# Patient Record
Sex: Female | Born: 1965
Health system: Southern US, Community
[De-identification: ages and names within clinical notes are randomized; demographics above are authoritative.]

## PROBLEM LIST (undated history)

## (undated) DIAGNOSIS — G039 Meningitis, unspecified: Secondary | ICD-10-CM

## (undated) DIAGNOSIS — M79606 Pain in leg, unspecified: Secondary | ICD-10-CM

## (undated) DIAGNOSIS — R142 Eructation: Secondary | ICD-10-CM

## (undated) DIAGNOSIS — M1990 Unspecified osteoarthritis, unspecified site: Secondary | ICD-10-CM

## (undated) HISTORY — PX: HAND SURGERY: SHX662

## (undated) HISTORY — DX: Eructation: R14.2

## (undated) HISTORY — PX: OTHER SURGICAL HISTORY: SHX169

## (undated) HISTORY — DX: Unspecified osteoarthritis, unspecified site: M19.90

---

## 2005-05-01 ENCOUNTER — Emergency Department (HOSPITAL_COMMUNITY): Admission: AC | Admit: 2005-05-01 | Discharge: 2005-05-01 | Payer: Self-pay

## 2005-05-04 ENCOUNTER — Emergency Department: Payer: Self-pay | Admitting: Unknown Physician Specialty

## 2005-05-07 ENCOUNTER — Emergency Department: Payer: Self-pay | Admitting: Emergency Medicine

## 2008-04-21 ENCOUNTER — Emergency Department: Payer: Self-pay | Admitting: Unknown Physician Specialty

## 2009-04-10 ENCOUNTER — Emergency Department: Payer: Self-pay | Admitting: Emergency Medicine

## 2009-09-29 ENCOUNTER — Ambulatory Visit: Payer: Self-pay | Admitting: Internal Medicine

## 2009-09-29 LAB — CONVERTED CEMR LAB: Microalb, Ur: 0.5 mg/dL (ref 0.00–1.89)

## 2009-10-05 ENCOUNTER — Ambulatory Visit (HOSPITAL_COMMUNITY): Admission: RE | Admit: 2009-10-05 | Discharge: 2009-10-05 | Payer: Self-pay | Admitting: Family Medicine

## 2009-10-14 ENCOUNTER — Encounter: Admission: RE | Admit: 2009-10-14 | Discharge: 2009-11-18 | Payer: Self-pay | Admitting: Family Medicine

## 2009-10-15 ENCOUNTER — Ambulatory Visit: Payer: Self-pay | Admitting: Internal Medicine

## 2009-10-15 LAB — CONVERTED CEMR LAB
ALT: 10 units/L (ref 0–35)
Albumin: 4.2 g/dL (ref 3.5–5.2)
Alkaline Phosphatase: 37 units/L — ABNORMAL LOW (ref 39–117)
Basophils Absolute: 0.1 10*3/uL (ref 0.0–0.1)
Basophils Relative: 2 % — ABNORMAL HIGH (ref 0–1)
CO2: 25 meq/L (ref 19–32)
Calcium: 9.6 mg/dL (ref 8.4–10.5)
Creatinine, Ser: 0.82 mg/dL (ref 0.40–1.20)
Eosinophils Relative: 4 % (ref 0–5)
HCT: 40.1 % (ref 36.0–46.0)
Lymphocytes Relative: 38 % (ref 12–46)
Lymphs Abs: 2.2 10*3/uL (ref 0.7–4.0)
Monocytes Absolute: 0.3 10*3/uL (ref 0.1–1.0)
Monocytes Relative: 5 % (ref 3–12)
Platelets: 403 10*3/uL — ABNORMAL HIGH (ref 150–400)
RBC: 4.23 M/uL (ref 3.87–5.11)
Sed Rate: 4 mm/hr (ref 0–22)
Sodium: 140 meq/L (ref 135–145)
Total CHOL/HDL Ratio: 3.2
Triglycerides: 38 mg/dL (ref <150)
VLDL: 8 mg/dL (ref 0–40)
Vit D, 25-Hydroxy: 41 ng/mL (ref 30–89)

## 2009-10-27 ENCOUNTER — Ambulatory Visit: Payer: Self-pay | Admitting: Internal Medicine

## 2009-12-16 ENCOUNTER — Emergency Department: Payer: Self-pay | Admitting: Unknown Physician Specialty

## 2009-12-22 ENCOUNTER — Ambulatory Visit: Payer: Self-pay | Admitting: Specialist

## 2009-12-25 ENCOUNTER — Ambulatory Visit: Payer: Self-pay | Admitting: Specialist

## 2010-01-07 ENCOUNTER — Ambulatory Visit: Payer: Self-pay | Admitting: Internal Medicine

## 2010-01-14 ENCOUNTER — Ambulatory Visit (HOSPITAL_COMMUNITY): Admission: RE | Admit: 2010-01-14 | Discharge: 2010-01-14 | Payer: Self-pay | Admitting: Internal Medicine

## 2010-02-03 ENCOUNTER — Encounter: Admission: RE | Admit: 2010-02-03 | Discharge: 2010-02-03 | Payer: Self-pay | Admitting: Family Medicine

## 2010-04-02 ENCOUNTER — Ambulatory Visit: Payer: Self-pay | Admitting: Internal Medicine

## 2011-03-03 ENCOUNTER — Emergency Department (HOSPITAL_COMMUNITY)
Admission: EM | Admit: 2011-03-03 | Discharge: 2011-03-03 | Disposition: A | Discharge status: Home / Self Care | Payer: Self-pay | Attending: Emergency Medicine | Admitting: Emergency Medicine

## 2011-03-03 ENCOUNTER — Emergency Department (HOSPITAL_COMMUNITY): Payer: Self-pay

## 2011-03-03 DIAGNOSIS — R110 Nausea: Secondary | ICD-10-CM | POA: Insufficient documentation

## 2011-03-03 DIAGNOSIS — N390 Urinary tract infection, site not specified: Secondary | ICD-10-CM | POA: Insufficient documentation

## 2011-03-03 DIAGNOSIS — R109 Unspecified abdominal pain: Secondary | ICD-10-CM | POA: Insufficient documentation

## 2011-03-03 DIAGNOSIS — R079 Chest pain, unspecified: Secondary | ICD-10-CM | POA: Insufficient documentation

## 2011-03-03 DIAGNOSIS — R6883 Chills (without fever): Secondary | ICD-10-CM | POA: Insufficient documentation

## 2011-03-03 LAB — URINALYSIS, ROUTINE W REFLEX MICROSCOPIC
Bilirubin Urine: NEGATIVE
Nitrite: POSITIVE — AB
Protein, ur: 30 mg/dL — AB
pH: 6.5 (ref 5.0–8.0)

## 2011-03-03 LAB — COMPREHENSIVE METABOLIC PANEL
ALT: 7 U/L (ref 0–35)
BUN: 10 mg/dL (ref 6–23)
GFR calc Af Amer: >60 mL/min (ref >60)
GFR calc non Af Amer: >60 mL/min (ref >60)
Sodium: 135 mEq/L (ref 135–145)

## 2011-03-03 LAB — LIPASE, BLOOD: Lipase: 16 U/L (ref 11–59)

## 2011-03-03 LAB — URINE MICROSCOPIC-ADD ON

## 2011-03-03 LAB — D-DIMER, QUANTITATIVE: D-Dimer, Quant: 0.88 ug/mL-FEU — ABNORMAL HIGH (ref 0.00–0.48)

## 2011-05-06 ENCOUNTER — Other Ambulatory Visit (HOSPITAL_COMMUNITY): Payer: Self-pay | Admitting: Family Medicine

## 2011-05-06 DIAGNOSIS — Z1231 Encounter for screening mammogram for malignant neoplasm of breast: Secondary | ICD-10-CM

## 2011-05-09 ENCOUNTER — Ambulatory Visit (HOSPITAL_COMMUNITY)
Admission: RE | Admit: 2011-05-09 | Discharge: 2011-05-09 | Disposition: A | Discharge status: Home / Self Care | Payer: Self-pay | Source: Ambulatory Visit | Attending: Family Medicine | Admitting: Family Medicine

## 2011-05-09 DIAGNOSIS — Z1231 Encounter for screening mammogram for malignant neoplasm of breast: Secondary | ICD-10-CM | POA: Insufficient documentation

## 2012-02-17 ENCOUNTER — Other Ambulatory Visit (HOSPITAL_COMMUNITY): Payer: Self-pay | Admitting: Family Medicine

## 2012-02-17 ENCOUNTER — Ambulatory Visit (HOSPITAL_COMMUNITY)
Admission: RE | Admit: 2012-02-17 | Discharge: 2012-02-17 | Disposition: A | Discharge status: Home / Self Care | Payer: Self-pay | Source: Ambulatory Visit | Attending: Family Medicine | Admitting: Family Medicine

## 2012-02-17 DIAGNOSIS — M25579 Pain in unspecified ankle and joints of unspecified foot: Secondary | ICD-10-CM | POA: Insufficient documentation

## 2012-02-17 DIAGNOSIS — R52 Pain, unspecified: Secondary | ICD-10-CM

## 2012-02-17 DIAGNOSIS — Z1231 Encounter for screening mammogram for malignant neoplasm of breast: Secondary | ICD-10-CM

## 2012-05-11 ENCOUNTER — Ambulatory Visit (HOSPITAL_COMMUNITY)
Admission: RE | Admit: 2012-05-11 | Discharge: 2012-05-11 | Disposition: A | Discharge status: Home / Self Care | Payer: Self-pay | Source: Ambulatory Visit | Attending: Family Medicine | Admitting: Family Medicine

## 2012-05-11 DIAGNOSIS — Z1231 Encounter for screening mammogram for malignant neoplasm of breast: Secondary | ICD-10-CM

## 2012-06-11 ENCOUNTER — Encounter (HOSPITAL_COMMUNITY): Payer: Self-pay

## 2012-06-11 ENCOUNTER — Inpatient Hospital Stay (HOSPITAL_COMMUNITY)
Admission: AD | Admit: 2012-06-11 | Discharge: 2012-06-11 | Disposition: A | Discharge status: Home / Self Care | Payer: Medicaid Other | Source: Ambulatory Visit | Attending: Obstetrics & Gynecology | Admitting: Obstetrics & Gynecology

## 2012-06-11 DIAGNOSIS — Z202 Contact with and (suspected) exposure to infections with a predominantly sexual mode of transmission: Secondary | ICD-10-CM

## 2012-06-11 DIAGNOSIS — A599 Trichomoniasis, unspecified: Secondary | ICD-10-CM

## 2012-06-11 DIAGNOSIS — A5901 Trichomonal vulvovaginitis: Secondary | ICD-10-CM | POA: Insufficient documentation

## 2012-06-11 DIAGNOSIS — M542 Cervicalgia: Secondary | ICD-10-CM | POA: Insufficient documentation

## 2012-06-11 DIAGNOSIS — M5430 Sciatica, unspecified side: Secondary | ICD-10-CM | POA: Insufficient documentation

## 2012-06-11 HISTORY — DX: Pain in leg, unspecified: M79.606

## 2012-06-11 HISTORY — DX: Meningitis, unspecified: G03.9

## 2012-06-11 LAB — URINALYSIS, ROUTINE W REFLEX MICROSCOPIC
Bilirubin Urine: NEGATIVE
Glucose, UA: NEGATIVE mg/dL
Hgb urine dipstick: NEGATIVE
Ketones, ur: NEGATIVE mg/dL
Specific Gravity, Urine: 1.01 (ref 1.005–1.030)
Urobilinogen, UA: 0.2 mg/dL (ref 0.0–1.0)
pH: 7 (ref 5.0–8.0)

## 2012-06-11 LAB — WET PREP, GENITAL
Clue Cells Wet Prep HPF POC: NONE SEEN
Yeast Wet Prep HPF POC: NONE SEEN

## 2012-06-11 MED ORDER — IBUPROFEN 600 MG PO TABS
600.0000 mg | ORAL_TABLET | Freq: Once | ORAL | Status: AC
  Administered 2012-06-11: 600 mg via ORAL
  Filled 2012-06-11: qty 1

## 2012-06-11 MED ORDER — CYCLOBENZAPRINE HCL 10 MG PO TABS: 10.0000 mg | ORAL_TABLET | Freq: Two times a day (BID) | ORAL | Status: DC | PRN

## 2012-06-11 MED ORDER — METRONIDAZOLE 500 MG PO TABS: 2000.0000 mg | ORAL_TABLET | Freq: Once | ORAL | Status: DC
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
## 2012-06-11 NOTE — MAU Note (Signed)
Had pain in back all the way up to her neck over the weekend, stiffness in her neck today.

## 2012-06-11 NOTE — MAU Note (Signed)
Patient also wants to be checked for STDs

## 2012-06-12 LAB — GC/CHLAMYDIA PROBE AMP, GENITAL: GC Probe Amp, Genital: NEGATIVE

## 2012-06-19 NOTE — MAU Provider Note (Signed)
Medical Screening exam and patient care preformed by advanced practice provider.  Agree with the above management.  

## 2012-10-02 ENCOUNTER — Emergency Department (HOSPITAL_COMMUNITY)
Admission: EM | Admit: 2012-10-02 | Discharge: 2012-10-02 | Disposition: A | Discharge status: Home / Self Care | Payer: No Typology Code available for payment source | Source: Home / Self Care

## 2012-10-02 ENCOUNTER — Encounter (HOSPITAL_COMMUNITY): Payer: Self-pay | Admitting: *Deleted

## 2012-10-02 DIAGNOSIS — Z760 Encounter for issue of repeat prescription: Secondary | ICD-10-CM

## 2012-10-02 DIAGNOSIS — M542 Cervicalgia: Secondary | ICD-10-CM

## 2012-10-02 DIAGNOSIS — M549 Dorsalgia, unspecified: Secondary | ICD-10-CM

## 2012-10-02 MED ORDER — CYCLOBENZAPRINE HCL 10 MG PO TABS: 10.0000 mg | ORAL_TABLET | Freq: Two times a day (BID) | ORAL | Status: DC | PRN

## 2012-10-02 MED ORDER — TRAMADOL HCL 50 MG PO TABS: 50.0000 mg | ORAL_TABLET | Freq: Two times a day (BID) | ORAL | Status: DC | PRN

## 2012-10-02 MED ORDER — DOCUSATE SODIUM 100 MG PO CAPS: 100.0000 mg | ORAL_CAPSULE | Freq: Two times a day (BID) | ORAL | Status: DC

## 2012-10-02 MED ORDER — MELOXICAM 7.5 MG PO TABS: 7.5000 mg | ORAL_TABLET | Freq: Every day | ORAL | Status: DC

## 2012-10-02 NOTE — ED Notes (Signed)
Referral faxed for chiropractor
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
## 2014-05-23 ENCOUNTER — Ambulatory Visit: Payer: Self-pay | Attending: Internal Medicine

## 2014-05-26 ENCOUNTER — Other Ambulatory Visit: Payer: Self-pay

## 2014-05-27 ENCOUNTER — Ambulatory Visit: Payer: Self-pay | Attending: Family Medicine | Admitting: *Deleted

## 2014-05-27 DIAGNOSIS — Z599 Problem related to housing and economic circumstances, unspecified: Secondary | ICD-10-CM

## 2014-05-27 NOTE — Progress Notes (Signed)
Patient identified that she had recently been released from prison and needed support with some community resources.  Patient needed help with job seeking, clothes and housing.  LCSW shared with patient that there were not many resources available for housing without a constant income. LCSW also provided patient with a list of places that provide a clothing resource in the community. LCSW provided patient with a list of potential jobs for patient to follow up.  LCSW also recommended the [facility] in order to enlist case management support.   There are no other needs at this time.  Patient will contact this social work if necessary.    Denise Wiley MSW, LCSW

## 2014-06-06 ENCOUNTER — Encounter: Payer: Self-pay | Admitting: Internal Medicine

## 2014-06-06 ENCOUNTER — Ambulatory Visit: Payer: Self-pay | Attending: Internal Medicine | Admitting: Internal Medicine

## 2014-06-06 VITALS — BP 122/78 | HR 76 | Temp 98.0°F | Resp 16 | Wt 126.4 lb

## 2014-06-06 DIAGNOSIS — M545 Low back pain: Secondary | ICD-10-CM | POA: Insufficient documentation

## 2014-06-06 DIAGNOSIS — Z139 Encounter for screening, unspecified: Secondary | ICD-10-CM

## 2014-06-06 DIAGNOSIS — R1013 Epigastric pain: Secondary | ICD-10-CM

## 2014-06-06 DIAGNOSIS — Z87891 Personal history of nicotine dependence: Secondary | ICD-10-CM | POA: Insufficient documentation

## 2014-06-06 DIAGNOSIS — K30 Functional dyspepsia: Secondary | ICD-10-CM | POA: Insufficient documentation

## 2014-06-06 DIAGNOSIS — G8929 Other chronic pain: Secondary | ICD-10-CM | POA: Insufficient documentation

## 2014-06-06 DIAGNOSIS — M62830 Muscle spasm of back: Secondary | ICD-10-CM | POA: Insufficient documentation

## 2014-06-06 LAB — COMPLETE METABOLIC PANEL WITH GFR
ALBUMIN: 4.5 g/dL (ref 3.5–5.2)
ALK PHOS: 35 U/L — AB (ref 39–117)
ALT: 9 U/L (ref 0–35)
AST: 19 U/L (ref 0–37)
BUN: 8 mg/dL (ref 6–23)
CO2: 26 mEq/L (ref 19–32)
Calcium: 9.4 mg/dL (ref 8.4–10.5)
Chloride: 103 mEq/L (ref 96–112)
Creat: 0.8 mg/dL (ref 0.50–1.10)
GFR, Est African American: >89 mL/min
GFR, Est Non African American: 87 mL/min
Glucose, Bld: 78 mg/dL (ref 70–99)
POTASSIUM: 5.1 meq/L (ref 3.5–5.3)
SODIUM: 139 meq/L (ref 135–145)
TOTAL PROTEIN: 7.3 g/dL (ref 6.0–8.3)
Total Bilirubin: 0.5 mg/dL (ref 0.2–1.2)

## 2014-06-06 LAB — CBC WITH DIFFERENTIAL/PLATELET
BASOS ABS: 0.1 10*3/uL (ref 0.0–0.1)
BASOS PCT: 1 % (ref 0–1)
Eosinophils Absolute: 0.1 10*3/uL (ref 0.0–0.7)
Eosinophils Relative: 1 % (ref 0–5)
HCT: 38.8 % (ref 36.0–46.0)
Hemoglobin: 12.8 g/dL (ref 12.0–15.0)
Lymphocytes Relative: 42 % (ref 12–46)
Lymphs Abs: 2.4 10*3/uL (ref 0.7–4.0)
MCH: 28.5 pg (ref 26.0–34.0)
MCHC: 33 g/dL (ref 30.0–36.0)
MCV: 86.4 fL (ref 78.0–100.0)
Monocytes Absolute: 0.3 10*3/uL (ref 0.1–1.0)
Monocytes Relative: 5 % (ref 3–12)
NEUTROS ABS: 3 10*3/uL (ref 1.7–7.7)
NEUTROS PCT: 51 % (ref 43–77)
Platelets: 355 10*3/uL (ref 150–400)
RBC: 4.49 MIL/uL (ref 3.87–5.11)
RDW: 14.4 % (ref 11.5–15.5)
WBC: 5.8 10*3/uL (ref 4.0–10.5)

## 2014-06-06 LAB — LIPID PANEL
CHOL/HDL RATIO: 5 ratio
Cholesterol: 306 mg/dL — ABNORMAL HIGH (ref 0–200)
HDL: 61 mg/dL (ref >39)
LDL CALC: 234 mg/dL — AB (ref 0–99)
Triglycerides: 54 mg/dL (ref <150)
VLDL: 11 mg/dL (ref 0–40)

## 2014-06-06 LAB — TSH: TSH: 0.191 u[IU]/mL — AB (ref 0.350–4.500)

## 2014-06-06 MED ORDER — CYCLOBENZAPRINE HCL 10 MG PO TABS: 10.0000 mg | ORAL_TABLET | Freq: Two times a day (BID) | ORAL | Status: DC | PRN

## 2014-06-06 MED ORDER — MELOXICAM 7.5 MG PO TABS: 7.5000 mg | ORAL_TABLET | Freq: Every day | ORAL | Status: DC

## 2014-06-06 MED ORDER — OMEPRAZOLE 20 MG PO CPDR: 20.0000 mg | DELAYED_RELEASE_CAPSULE | Freq: Every day | ORAL | Status: DC
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
## 2014-06-06 NOTE — Progress Notes (Signed)
Patient here to establish care Patient states she has a pinched nerve and takes flexeril and tramadol Past was incarcerated for the last sixteen months

## 2014-06-07 LAB — HEMOGLOBIN A1C
Hgb A1c MFr Bld: 5.4 % (ref <5.7)
Mean Plasma Glucose: 108 mg/dL (ref <117)

## 2014-06-07 LAB — VITAMIN D 25 HYDROXY (VIT D DEFICIENCY, FRACTURES): VIT D 25 HYDROXY: 50 ng/mL (ref 30–89)

## 2014-06-09 ENCOUNTER — Encounter: Payer: Self-pay | Admitting: Internal Medicine

## 2014-06-09 ENCOUNTER — Other Ambulatory Visit: Payer: Self-pay | Admitting: Internal Medicine

## 2014-06-09 DIAGNOSIS — R7989 Other specified abnormal findings of blood chemistry: Secondary | ICD-10-CM

## 2014-06-11 ENCOUNTER — Telehealth: Payer: Self-pay | Admitting: Emergency Medicine

## 2014-06-11 MED ORDER — ATORVASTATIN CALCIUM 20 MG PO TABS: 20.0000 mg | ORAL_TABLET | Freq: Every day | ORAL | Status: DC

## 2014-06-11 NOTE — Telephone Encounter (Signed)
-----   Message from Doris Cheadleeepak Advani, MD sent at 06/09/2014 12:31 PM EST ----- Blood work reviewed noticed elevated cholesterol, advise patient for low fat diet and start taking Lipitor 20 mg daily. Also noticed abnormal TSH level, I have ordered full TFT panel, advise patient to do blood work within week.

## 2014-06-11 NOTE — Telephone Encounter (Signed)
Pt given lab results with instructions to start new medication Lipitor 20 mg tab daily  Scheduled lab appointment for next Friday TFT

## 2014-06-20 ENCOUNTER — Other Ambulatory Visit: Payer: Self-pay

## 2014-06-23 ENCOUNTER — Ambulatory Visit: Payer: Self-pay | Attending: Internal Medicine

## 2014-06-23 DIAGNOSIS — R7989 Other specified abnormal findings of blood chemistry: Secondary | ICD-10-CM

## 2014-06-23 LAB — TSH: TSH: 0.242 u[IU]/mL — ABNORMAL LOW (ref 0.350–4.500)

## 2014-06-23 LAB — T4, FREE: FREE T4: 0.9 ng/dL (ref 0.80–1.80)

## 2014-06-23 LAB — T3, FREE: T3, Free: 2.9 pg/mL (ref 2.3–4.2)

## 2014-06-23 MED ORDER — ATORVASTATIN CALCIUM 20 MG PO TABS: 20.0000 mg | ORAL_TABLET | Freq: Every day | ORAL | Status: AC

## 2014-06-26 LAB — THYROID STIMULATING IMMUNOGLOBULIN: TSI: 27 % baseline (ref <140)

## 2014-09-03 ENCOUNTER — Ambulatory Visit (HOSPITAL_COMMUNITY)
Admission: RE | Admit: 2014-09-03 | Discharge: 2014-09-03 | Disposition: A | Discharge status: Home / Self Care | Payer: Self-pay | Source: Ambulatory Visit | Attending: Internal Medicine | Admitting: Internal Medicine

## 2014-09-03 ENCOUNTER — Encounter: Payer: Self-pay | Admitting: Internal Medicine

## 2014-09-03 ENCOUNTER — Ambulatory Visit: Payer: Self-pay | Attending: Internal Medicine | Admitting: Internal Medicine

## 2014-09-03 VITALS — BP 128/84 | HR 86 | Temp 98.6°F | Resp 17 | Wt 128.0 lb

## 2014-09-03 DIAGNOSIS — R1013 Epigastric pain: Secondary | ICD-10-CM | POA: Insufficient documentation

## 2014-09-03 DIAGNOSIS — R2232 Localized swelling, mass and lump, left upper limb: Secondary | ICD-10-CM | POA: Insufficient documentation

## 2014-09-03 DIAGNOSIS — Z87891 Personal history of nicotine dependence: Secondary | ICD-10-CM | POA: Insufficient documentation

## 2014-09-03 DIAGNOSIS — R946 Abnormal results of thyroid function studies: Secondary | ICD-10-CM | POA: Insufficient documentation

## 2014-09-03 DIAGNOSIS — R2242 Localized swelling, mass and lump, left lower limb: Secondary | ICD-10-CM | POA: Insufficient documentation

## 2014-09-03 DIAGNOSIS — Z79899 Other long term (current) drug therapy: Secondary | ICD-10-CM | POA: Insufficient documentation

## 2014-09-03 DIAGNOSIS — K5900 Constipation, unspecified: Secondary | ICD-10-CM | POA: Insufficient documentation

## 2014-09-03 DIAGNOSIS — R2230 Localized swelling, mass and lump, unspecified upper limb: Secondary | ICD-10-CM | POA: Insufficient documentation

## 2014-09-03 DIAGNOSIS — Z1211 Encounter for screening for malignant neoplasm of colon: Secondary | ICD-10-CM

## 2014-09-03 DIAGNOSIS — R7989 Other specified abnormal findings of blood chemistry: Secondary | ICD-10-CM | POA: Insufficient documentation

## 2014-09-03 DIAGNOSIS — E785 Hyperlipidemia, unspecified: Secondary | ICD-10-CM | POA: Insufficient documentation

## 2014-09-03 DIAGNOSIS — Z1231 Encounter for screening mammogram for malignant neoplasm of breast: Secondary | ICD-10-CM

## 2014-09-03 DIAGNOSIS — Z791 Long term (current) use of non-steroidal anti-inflammatories (NSAID): Secondary | ICD-10-CM | POA: Insufficient documentation

## 2014-09-03 MED ORDER — MAGNESIUM HYDROXIDE 400 MG/5ML PO SUSP: 15.0000 mL | Freq: Every day | ORAL | Status: DC | PRN

## 2014-09-03 MED ORDER — OMEPRAZOLE 40 MG PO CPDR: 40.0000 mg | DELAYED_RELEASE_CAPSULE | Freq: Every day | ORAL | Status: DC
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
## 2014-09-03 NOTE — Progress Notes (Signed)
Patient here for follow up Complains of belching a lot Concerned about her weight gain Would like a referral for a mammogram Having some neck pain and would like to have x rays done Complains of lump to the top of her left hand that she noticed about  Two weeks ago

## 2014-09-04 ENCOUNTER — Encounter: Payer: Self-pay | Admitting: Gastroenterology

## 2014-09-10 ENCOUNTER — Telehealth: Payer: Self-pay

## 2014-09-10 NOTE — Telephone Encounter (Signed)
-----   Message from Doris Cheadleeepak Advani, MD sent at 09/09/2014  6:04 PM EST ----- Call and let the patient know that her wrist X-ray reported. IMPRESSION: Mild soft tissue prominence posterior wrist. No foreign body. No underlying bony abnormality . If she has persistent symptoms, she can be referred to orthopedics.

## 2014-09-10 NOTE — Telephone Encounter (Signed)
Spoke with patient and informed her of below results. At this time she does not want a referral, but will follow up if one is needed

## 2014-09-24 ENCOUNTER — Ambulatory Visit (HOSPITAL_COMMUNITY)
Admission: RE | Admit: 2014-09-24 | Discharge: 2014-09-24 | Disposition: A | Discharge status: Home / Self Care | Payer: Self-pay | Source: Ambulatory Visit | Attending: Internal Medicine | Admitting: Internal Medicine

## 2014-09-24 DIAGNOSIS — Z1231 Encounter for screening mammogram for malignant neoplasm of breast: Secondary | ICD-10-CM | POA: Insufficient documentation

## 2014-10-03 ENCOUNTER — Telehealth: Payer: Self-pay | Admitting: Internal Medicine

## 2014-10-03 NOTE — Telephone Encounter (Signed)
Pt is requesting a refill for cyclobenzaprine (FLEXERIL) 10 MG tablet to be sent to [facility] off [address] [place]. [phone].

## 2014-10-03 NOTE — Telephone Encounter (Signed)
Patient can be given refill  

## 2014-10-06 ENCOUNTER — Other Ambulatory Visit: Payer: Self-pay | Admitting: Internal Medicine

## 2014-11-17 ENCOUNTER — Ambulatory Visit (AMBULATORY_SURGERY_CENTER): Payer: Self-pay | Admitting: *Deleted

## 2014-11-17 VITALS — Ht 60.0 in | Wt 123.4 lb

## 2014-11-17 DIAGNOSIS — Z1211 Encounter for screening for malignant neoplasm of colon: Secondary | ICD-10-CM

## 2014-11-17 NOTE — Progress Notes (Signed)
No egg or soy allergy No home 02  No diet pills No issues with past sedation No e mail for video

## 2014-11-26 ENCOUNTER — Emergency Department
Admit: 2014-11-26 | Disposition: A | Discharge status: Home / Self Care | Payer: Self-pay | Admitting: Emergency Medicine

## 2014-11-26 LAB — COMPREHENSIVE METABOLIC PANEL
ALK PHOS: 38 U/L
ALT: 15 U/L
Albumin: 4.2 g/dL
Anion Gap: 9 (ref 7–16)
BUN: 7 mg/dL
Bilirubin,Total: 0.4 mg/dL
Calcium, Total: 9.3 mg/dL
Chloride: 100 mmol/L — ABNORMAL LOW
Co2: 30 mmol/L
Creatinine: 0.69 mg/dL
EGFR (African American): >60
EGFR (Non-African Amer.): >60
Glucose: 101 mg/dL — ABNORMAL HIGH
Potassium: 3.2 mmol/L — ABNORMAL LOW
SGOT(AST): 26 U/L
Sodium: 139 mmol/L
TOTAL PROTEIN: 7.3 g/dL

## 2014-11-26 LAB — CBC
HCT: 35.8 % (ref 35.0–47.0)
HGB: 11.9 g/dL — ABNORMAL LOW (ref 12.0–16.0)
MCH: 29.4 pg (ref 26.0–34.0)
MCHC: 33.1 g/dL (ref 32.0–36.0)
MCV: 89 fL (ref 80–100)
Platelet: 251 10*3/uL (ref 150–440)
RBC: 4.03 10*6/uL (ref 3.80–5.20)
RDW: 14.6 % — ABNORMAL HIGH (ref 11.5–14.5)
WBC: 6.1 10*3/uL (ref 3.6–11.0)

## 2014-11-26 LAB — TROPONIN I: Troponin-I: <0.03 ng/mL

## 2014-11-26 LAB — CK TOTAL AND CKMB (NOT AT ARMC)
CK, TOTAL: 90 U/L
CK-MB: 1 ng/mL

## 2014-12-01 ENCOUNTER — Ambulatory Visit (AMBULATORY_SURGERY_CENTER): Payer: Self-pay | Admitting: Gastroenterology

## 2014-12-01 ENCOUNTER — Encounter: Payer: Self-pay | Admitting: Gastroenterology

## 2014-12-01 VITALS — BP 111/93 | HR 76 | Temp 98.5°F | Resp 16 | Ht 60.0 in | Wt 124.0 lb

## 2014-12-01 DIAGNOSIS — Z1211 Encounter for screening for malignant neoplasm of colon: Secondary | ICD-10-CM

## 2014-12-01 MED ORDER — SODIUM CHLORIDE 0.9 % IV SOLN: 500.0000 mL | INTRAVENOUS | Status: DC

## 2014-12-01 NOTE — Progress Notes (Signed)
A/ox3, pleased with MAC, report to RN 

## 2014-12-01 NOTE — Op Note (Signed)
[facility] [address] [place], [id number]   COLONOSCOPY PROCEDURE REPORT  PATIENT: Denise Wiley, Denise L  MR#: [id number] [facility]THA[facility] LaboratoriesDATE: Dec 18, 1965 , 49  yrs. old GENDER: female ENDOSCOPIST: Meryl DareMalcolm T Jesselle Laflamme, MD, [facility] REFERRED XB:JYNWGNBY:Advani, Deepak PROCEDURE DATE:  12/01/2014 PROCEDURE:   Colonoscopy, screening First Screening Colonoscopy - Avg.  risk and is 50 yrs.  old or older Yes.  Prior Negative Screening - Now for repeat screening. N/A  History of Adenoma - Now for follow-up colonoscopy & has been > or = to 3 yrs.  N/A ASA CLASS:   Class II INDICATIONS:Screening for colonic neoplasia and Colorectal Neoplasm Risk Assessment for this procedure is average risk. MEDICATIONS: Monitored anesthesia care and Propofol 200 mg IV DESCRIPTION OF PROCEDURE:   After the risks benefits and alternatives of the procedure were thoroughly explained, informed consent was obtained.  The digital rectal exam revealed no abnormalities of the rectum.   The LB [id number]  endoscope was introduced through the anus and advanced to the cecum, which was identified by both the appendix and ileocecal valve. No adverse events experienced with a tortuous colon.   The quality of the prep was good.  (MoviPrep was used)  The instrument was then slowly withdrawn as the colon was fully examined.    COLON FINDINGS: There was mild diverticulosis noted in the sigmoid colon.   The colonic mucosa appeared normal at the splenic flexure, in the transverse colon, rectum, descending colon, at the ileocecal valve, cecum, hepatic flexure, and in the ascending colon. Retroflexed views revealed no abnormalities. The time to cecum = 4.4 Withdrawal time = 9.5   The scope was withdrawn and the procedure completed. COMPLICATIONS: There were no immediate complications.  ENDOSCOPIC IMPRESSION: 1.   Mild diverticulosis in the sigmoid colon 2.   The colonoscopy otherwise appeared normal  RECOMMENDATIONS: 1.   Continue current colorectal screening recommendations for "routine risk" patients with a repeat colonoscopy in 10 years.  eSigned:  Meryl DareMalcolm T Nerissa Constantin, MD, [facility] 12/01/2014 11:37 AM

## 2014-12-01 NOTE — Patient Instructions (Signed)
YOU HAD AN ENDOSCOPIC PROCEDURE TODAY AT THE [facility]:   Refer to the procedure report that was given to you for any specific questions about what was found during the examination.  If the procedure report does not answer your questions, please call your gastroenterologist to clarify.  If you requested that your care partner not be given the details of your procedure findings, then the procedure report has been included in a sealed envelope for you to review at your convenience later.  YOU SHOULD EXPECT: Some feelings of bloating in the abdomen. Passage of more gas than usual.  Walking can help get rid of the air that was put into your GI tract during the procedure and reduce the bloating. If you had a lower endoscopy (such as a colonoscopy or flexible sigmoidoscopy) you may notice spotting of blood in your stool or on the toilet paper. If you underwent a bowel prep for your procedure, you may not have a normal bowel movement for a few days.  Please Note:  You might notice some irritation and congestion in your nose or some drainage.  This is from the oxygen used during your procedure.  There is no need for concern and it should clear up in a day or so.  SYMPTOMS TO REPORT IMMEDIATELY:   Following lower endoscopy (colonoscopy or flexible sigmoidoscopy):  Excessive amounts of blood in the stool  Significant tenderness or worsening of abdominal pains  Swelling of the abdomen that is new, acute  Fever of 100F or higher   For urgent or emergent issues, a gastroenterologist can be reached at any hour by calling [phone].   DIET: Your first meal following the procedure should be a small meal and then it is ok to progress to your normal diet. Heavy or fried foods are harder to digest and may make you feel nauseous or bloated.  Likewise, meals heavy in dairy and vegetables can increase bloating.  Drink plenty of fluids but you should avoid alcoholic beverages for 24  hours.  ACTIVITY:  You should plan to take it easy for the rest of today and you should NOT DRIVE or use heavy machinery until tomorrow (because of the sedation medicines used during the test).    FOLLOW UP: Our staff will call the number listed on your records the next business day following your procedure to check on you and address any questions or concerns that you may have regarding the information given to you following your procedure. If we do not reach you, we will leave a message.  However, if you are feeling well and you are not experiencing any problems, there is no need to return our call.  We will assume that you have returned to your regular daily activities without incident.  If any biopsies were taken you will be contacted by phone or by letter within the next 1-3 weeks.  Please call us at [phone] if you have not heard about the biopsies in 3 weeks.    SIGNATURES/CONFIDENTIALITY: You and/or your care partner have signed paperwork which will be entered into your electronic medical record.  These signatures attest to the fact that that the information above on your After Visit Summary has been reviewed and is understood.  Full responsibility of the confidentiality of this discharge information lies with you and/or your care-partner. 

## 2014-12-03 ENCOUNTER — Encounter: Payer: Self-pay | Admitting: Internal Medicine

## 2014-12-03 ENCOUNTER — Ambulatory Visit: Payer: Self-pay | Attending: Internal Medicine | Admitting: Internal Medicine

## 2014-12-03 VITALS — BP 113/79 | HR 94 | Temp 98.0°F | Resp 16 | Wt 119.4 lb

## 2014-12-03 DIAGNOSIS — Z79899 Other long term (current) drug therapy: Secondary | ICD-10-CM | POA: Insufficient documentation

## 2014-12-03 DIAGNOSIS — R0981 Nasal congestion: Secondary | ICD-10-CM

## 2014-12-03 DIAGNOSIS — R7989 Other specified abnormal findings of blood chemistry: Secondary | ICD-10-CM | POA: Insufficient documentation

## 2014-12-03 DIAGNOSIS — M545 Low back pain: Secondary | ICD-10-CM | POA: Insufficient documentation

## 2014-12-03 DIAGNOSIS — E785 Hyperlipidemia, unspecified: Secondary | ICD-10-CM | POA: Insufficient documentation

## 2014-12-03 DIAGNOSIS — G8929 Other chronic pain: Secondary | ICD-10-CM | POA: Insufficient documentation

## 2014-12-03 DIAGNOSIS — M1990 Unspecified osteoarthritis, unspecified site: Secondary | ICD-10-CM | POA: Insufficient documentation

## 2014-12-03 DIAGNOSIS — F17210 Nicotine dependence, cigarettes, uncomplicated: Secondary | ICD-10-CM | POA: Insufficient documentation

## 2014-12-03 LAB — LIPID PANEL
Cholesterol: 171 mg/dL (ref 0–200)
HDL: 39 mg/dL — ABNORMAL LOW (ref >=46)
LDL CALC: 111 mg/dL — AB (ref 0–99)
TRIGLYCERIDES: 103 mg/dL (ref <150)
Total CHOL/HDL Ratio: 4.4 Ratio
VLDL: 21 mg/dL (ref 0–40)

## 2014-12-03 LAB — TSH: TSH: 0.112 u[IU]/mL — AB (ref 0.350–4.500)

## 2014-12-03 MED ORDER — FLUTICASONE PROPIONATE 50 MCG/ACT NA SUSP: 2.0000 | Freq: Every day | NASAL | Status: DC

## 2014-12-03 MED ORDER — GABAPENTIN 300 MG PO CAPS: 300.0000 mg | ORAL_CAPSULE | Freq: Three times a day (TID) | ORAL | Status: DC

## 2014-12-03 NOTE — Progress Notes (Signed)
Patient here for follow up Patient states she was cleaning out her mom's garage and ingested something Was seen in the ED and given a cough medicine but still having some burning to her chest Patient also complains of having a pinched nerve and it affects both of her legs Making it hard for her to eat at night
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
## 2014-12-03 NOTE — Patient Instructions (Signed)
Fat and Cholesterol Control Diet Fat and cholesterol levels in your blood and organs are influenced by your diet. High levels of fat and cholesterol may lead to diseases of the heart, small and large blood vessels, gallbladder, liver, and pancreas. CONTROLLING FAT AND CHOLESTEROL WITH DIET Although exercise and lifestyle factors are important, your diet is key. That is because certain foods are known to raise cholesterol and others to lower it. The goal is to balance foods for their effect on cholesterol and more importantly, to replace saturated and trans fat with other types of fat, such as monounsaturated fat, polyunsaturated fat, and omega-3 fatty acids. On average, a person should consume no more than 15 to 17 g of saturated fat daily. Saturated and trans fats are considered "bad" fats, and they will raise LDL cholesterol. Saturated fats are primarily found in animal products such as meats, butter, and cream. However, that does not mean you need to give up all your favorite foods. Today, there are good tasting, low-fat, low-cholesterol substitutes for most of the things you like to eat. Choose low-fat or nonfat alternatives. Choose round or loin cuts of red meat. These types of cuts are lowest in fat and cholesterol. Chicken (without the skin), fish, veal, and ground turkey breast are great choices. Eliminate fatty meats, such as hot dogs and salami. Even shellfish have little or no saturated fat. Have a 3 oz (85 g) portion when you eat lean meat, poultry, or fish. Trans fats are also called "partially hydrogenated oils." They are oils that have been scientifically manipulated so that they are solid at room temperature resulting in a longer shelf life and improved taste and texture of foods in which they are added. Trans fats are found in stick margarine, some tub margarines, cookies, crackers, and baked goods.  When baking and cooking, oils are a great substitute for butter. The monounsaturated oils are  especially beneficial since it is believed they lower LDL and raise HDL. The oils you should avoid entirely are saturated tropical oils, such as coconut and palm.  Remember to eat a lot from food groups that are naturally free of saturated and trans fat, including fish, fruit, vegetables, beans, grains (barley, rice, couscous, bulgur wheat), and pasta (without cream sauces).  IDENTIFYING FOODS THAT LOWER FAT AND CHOLESTEROL  Soluble fiber may lower your cholesterol. This type of fiber is found in fruits such as apples, vegetables such as broccoli, potatoes, and carrots, legumes such as beans, peas, and lentils, and grains such as barley. Foods fortified with plant sterols (phytosterol) may also lower cholesterol. You should eat at least 2 g per day of these foods for a cholesterol lowering effect.  Read package labels to identify low-saturated fats, trans fat free, and low-fat foods at the supermarket. Select cheeses that have only 2 to 3 g saturated fat per ounce. Use a heart-healthy tub margarine that is free of trans fats or partially hydrogenated oil. When buying baked goods (cookies, crackers), avoid partially hydrogenated oils. Breads and muffins should be made from whole grains (whole-wheat or whole oat flour, instead of "flour" or "enriched flour"). Buy non-creamy canned soups with reduced salt and no added fats.  FOOD PREPARATION TECHNIQUES  Never deep-fry. If you must fry, either stir-fry, which uses very little fat, or use non-stick cooking sprays. When possible, broil, bake, or roast meats, and steam vegetables. Instead of putting butter or margarine on vegetables, use lemon and herbs, applesauce, and cinnamon (for squash and sweet potatoes). Use nonfat   yogurt, salsa, and low-fat dressings for salads.  LOW-SATURATED FAT / LOW-FAT FOOD SUBSTITUTES Meats / Saturated Fat (g)  Avoid: Steak, marbled (3 oz/85 g) / 11 g  Choose: Steak, lean (3 oz/85 g) / 4 g  Avoid: Hamburger (3 oz/85 g) / 7  g  Choose: Hamburger, lean (3 oz/85 g) / 5 g  Avoid: Ham (3 oz/85 g) / 6 g  Choose: Ham, lean cut (3 oz/85 g) / 2.4 g  Avoid: Chicken, with skin, dark meat (3 oz/85 g) / 4 g  Choose: Chicken, skin removed, dark meat (3 oz/85 g) / 2 g  Avoid: Chicken, with skin, light meat (3 oz/85 g) / 2.5 g  Choose: Chicken, skin removed, light meat (3 oz/85 g) / 1 g Dairy / Saturated Fat (g)  Avoid: Whole milk (1 cup) / 5 g  Choose: Low-fat milk, 2% (1 cup) / 3 g  Choose: Low-fat milk, 1% (1 cup) / 1.5 g  Choose: Skim milk (1 cup) / 0.3 g  Avoid: Hard cheese (1 oz/28 g) / 6 g  Choose: Skim milk cheese (1 oz/28 g) / 2 to 3 g  Avoid: Cottage cheese, 4% fat (1 cup) / 6.5 g  Choose: Low-fat cottage cheese, 1% fat (1 cup) / 1.5 g  Avoid: Ice cream (1 cup) / 9 g  Choose: Sherbet (1 cup) / 2.5 g  Choose: Nonfat frozen yogurt (1 cup) / 0.3 g  Choose: Frozen fruit bar / trace  Avoid: Whipped cream (1 tbs) / 3.5 g  Choose: Nondairy whipped topping (1 tbs) / 1 g Condiments / Saturated Fat (g)  Avoid: Mayonnaise (1 tbs) / 2 g  Choose: Low-fat mayonnaise (1 tbs) / 1 g  Avoid: Butter (1 tbs) / 7 g  Choose: Extra light margarine (1 tbs) / 1 g  Avoid: Coconut oil (1 tbs) / 11.8 g  Choose: Olive oil (1 tbs) / 1.8 g  Choose: Corn oil (1 tbs) / 1.7 g  Choose: Safflower oil (1 tbs) / 1.2 g  Choose: Sunflower oil (1 tbs) / 1.4 g  Choose: Soybean oil (1 tbs) / 2.4 g  Choose: Canola oil (1 tbs) / 1 g Document Released: 07/25/2005 Document Revised: 11/19/2012 Document Reviewed: 10/23/2013 Exit[employer] Patient Information 2015 [employer]. This information is not intended to replace advice given to you by your health care provider. Make sure you discuss any questions you have with your health care provider.  

## 2014-12-04 ENCOUNTER — Telehealth: Payer: Self-pay

## 2014-12-04 NOTE — Telephone Encounter (Signed)
-----   Message from Doris Cheadleeepak Advani, MD sent at 12/04/2014  9:18 AM EDT ----- Call and let the patient know that her cholesterol is improved, continue with low-fat diet and her medications. Again noted abnormal TSH level, her free T4 level has been normal in the past. patient can be referred to endocrinology for further evaluation.

## 2014-12-04 NOTE — Telephone Encounter (Signed)
Patient not available Left message with family member to have her return our call 

## 2014-12-29 ENCOUNTER — Ambulatory Visit: Payer: Self-pay | Attending: Internal Medicine

## 2015-03-11 ENCOUNTER — Encounter: Payer: Self-pay | Admitting: Internal Medicine

## 2015-03-11 ENCOUNTER — Ambulatory Visit: Payer: Self-pay | Attending: Internal Medicine | Admitting: Internal Medicine

## 2015-03-11 VITALS — BP 114/81 | HR 68 | Temp 98.0°F | Resp 16 | Wt 118.6 lb

## 2015-03-11 DIAGNOSIS — F17210 Nicotine dependence, cigarettes, uncomplicated: Secondary | ICD-10-CM | POA: Insufficient documentation

## 2015-03-11 DIAGNOSIS — M545 Low back pain: Secondary | ICD-10-CM | POA: Insufficient documentation

## 2015-03-11 DIAGNOSIS — M62830 Muscle spasm of back: Secondary | ICD-10-CM | POA: Insufficient documentation

## 2015-03-11 DIAGNOSIS — G8929 Other chronic pain: Secondary | ICD-10-CM | POA: Insufficient documentation

## 2015-03-11 DIAGNOSIS — Z79899 Other long term (current) drug therapy: Secondary | ICD-10-CM | POA: Insufficient documentation

## 2015-03-11 MED ORDER — CYCLOBENZAPRINE HCL 10 MG PO TABS: 10.0000 mg | ORAL_TABLET | Freq: Two times a day (BID) | ORAL | Status: DC | PRN

## 2015-03-11 MED ORDER — NAPROXEN 500 MG PO TABS: 500.0000 mg | ORAL_TABLET | Freq: Two times a day (BID) | ORAL | Status: DC

## 2015-03-11 MED ORDER — GABAPENTIN 300 MG PO CAPS: 300.0000 mg | ORAL_CAPSULE | Freq: Three times a day (TID) | ORAL | Status: DC

## 2015-03-11 NOTE — Progress Notes (Signed)
Patient here for follow up States the gabapentin is not working Patient still having pain from her hips all the way down her legs Which causes her not to sleep well
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
## 2015-07-13 ENCOUNTER — Ambulatory Visit: Payer: Self-pay | Admitting: Family Medicine

## 2015-07-20 ENCOUNTER — Ambulatory Visit: Payer: Self-pay

## 2015-07-27 ENCOUNTER — Ambulatory Visit: Payer: Self-pay | Attending: Family Medicine | Admitting: Family Medicine

## 2015-07-27 ENCOUNTER — Encounter: Payer: Self-pay | Admitting: Family Medicine

## 2015-07-27 VITALS — BP 127/81 | HR 72 | Temp 98.3°F | Resp 16 | Ht 60.0 in | Wt 108.0 lb

## 2015-07-27 DIAGNOSIS — R7989 Other specified abnormal findings of blood chemistry: Secondary | ICD-10-CM

## 2015-07-27 DIAGNOSIS — E876 Hypokalemia: Secondary | ICD-10-CM

## 2015-07-27 DIAGNOSIS — R946 Abnormal results of thyroid function studies: Secondary | ICD-10-CM | POA: Insufficient documentation

## 2015-07-27 DIAGNOSIS — M62830 Muscle spasm of back: Secondary | ICD-10-CM

## 2015-07-27 DIAGNOSIS — Z79899 Other long term (current) drug therapy: Secondary | ICD-10-CM | POA: Insufficient documentation

## 2015-07-27 DIAGNOSIS — G8929 Other chronic pain: Secondary | ICD-10-CM | POA: Insufficient documentation

## 2015-07-27 DIAGNOSIS — M545 Low back pain, unspecified: Secondary | ICD-10-CM

## 2015-07-27 DIAGNOSIS — E785 Hyperlipidemia, unspecified: Secondary | ICD-10-CM

## 2015-07-27 MED ORDER — NAPROXEN 500 MG PO TABS: 500.0000 mg | ORAL_TABLET | Freq: Two times a day (BID) | ORAL | Status: DC

## 2015-07-27 MED ORDER — CYCLOBENZAPRINE HCL 10 MG PO TABS: 10.0000 mg | ORAL_TABLET | Freq: Two times a day (BID) | ORAL | Status: DC | PRN

## 2015-07-27 NOTE — Progress Notes (Signed)
Est.care w/ provider, lower back and leg pain. Rate pain 6.5/10. Patient describes pain stiffness and shooting, numbness.  Patient requesting refill on Flexeril and Naproxen.

## 2015-07-27 NOTE — Patient Instructions (Signed)

## 2015-07-27 NOTE — Progress Notes (Signed)
Subjective:  Patient ID: Denise Wiley, female    DOB: 10-06-1965  Age: 49 y.o. MRN: [id number]  CC: Establish Care and Back Pain   HPI Denise Wiley presents for a follow-up of low back pain which she has had chronically and states she has been out of her naproxen and Flexeril and symptoms have worsened with the cold. She also has pain in her legs which are intermittent and located in the anterior shins of both legs. Back pain does not radiate to her legs.  Medical history is notable for chronic low back pain, hyperlipidemia, tobacco abuse; on further questioning she has not been compliant with her statin which she stopped on her own and has been taking herbs and working on low cholesterol diet.  She continues to smoke a pack of cigarettes a week and is not ready to quit at this time; she is refusing a flu shot.  Outpatient Prescriptions Prior to Visit  Medication Sig Dispense Refill  . cyclobenzaprine (FLEXERIL) 10 MG tablet Take 1 tablet (10 mg total) by mouth 2 (two) times daily as needed for muscle spasms. 60 tablet 3  . naproxen (NAPROSYN) 500 MG tablet Take 1 tablet (500 mg total) by mouth 2 (two) times daily with a meal. 30 tablet 3  . atorvastatin (LIPITOR) 20 MG tablet Take 1 tablet (20 mg total) by mouth daily. (Patient not taking: Reported on 07/27/2015) 90 tablet 3  . FIBER PO Take 1 tablet by mouth daily. Reported on 07/27/2015    . vitamin D, CHOLECALCIFEROL, 400 UNITS tablet Take 400 Units by mouth daily. Reported on 07/27/2015    . docusate sodium (COLACE) 100 MG capsule Take 1 capsule (100 mg total) by mouth 2 (two) times daily. (Patient not taking: Reported on 07/27/2015) 10 capsule 0  . fluticasone (FLONASE) 50 MCG/ACT nasal spray Place 2 sprays into both nostrils daily. (Patient not taking: Reported on 07/27/2015) 16 g 2  . gabapentin (NEURONTIN) 300 MG capsule Take 1 capsule (300 mg total) by mouth 3 (three) times daily. (Patient not taking: Reported on  07/27/2015) 90 capsule 3  . magnesium hydroxide (MILK OF MAGNESIA) 400 MG/5ML suspension Take 15 mLs by mouth daily as needed for mild constipation. (Patient not taking: Reported on 07/27/2015) 360 mL 0  . omeprazole (PRILOSEC) 40 MG capsule Take 1 capsule (40 mg total) by mouth daily. (Patient not taking: Reported on 07/27/2015) 30 capsule 3   No facility-administered medications prior to visit.    ROS Review of Systems  Constitutional: Negative for activity change, appetite change and fatigue.  HENT: Negative for congestion, sinus pressure and sore throat.   Eyes: Negative for visual disturbance.  Respiratory: Negative for cough, chest tightness, shortness of breath and wheezing.   Cardiovascular: Negative for chest pain and palpitations.  Gastrointestinal: Negative for abdominal pain, constipation and abdominal distention.  Endocrine: Negative for polydipsia.  Genitourinary: Negative for dysuria and frequency.  Musculoskeletal: Positive for back pain. Negative for arthralgias.       Bilateral leg pain in the shins  Skin: Negative for rash.  Neurological: Negative for tremors, light-headedness and numbness.  Hematological: Does not bruise/bleed easily.  Psychiatric/Behavioral: Negative for behavioral problems and agitation.    Objective:  BP 127/81 mmHg  Pulse 72  Temp(Src) 98.3 F (36.8 C) (Oral)  Resp 16  Ht 5' (1.524 m)  Wt 108 lb (48.988 kg)  BMI 21.09 kg/m2  SpO2 99%  LMP 07/08/2015  BP/Weight 07/27/2015 03/11/2015 12/03/2014  Systolic BP  127 114 113  Diastolic BP 81 81 79  Wt. (Lbs) 108 118.6 119.4  BMI 21.09 23.16 23.32      Physical Exam  Constitutional: She is oriented to person, place, and time. She appears well-developed and well-nourished.  Cardiovascular: Normal rate, normal heart sounds and intact distal pulses.   No murmur heard. Pulmonary/Chest: Effort normal and breath sounds normal. She has no wheezes. She has no rales. She exhibits no tenderness.    Abdominal: Soft. Bowel sounds are normal. She exhibits no distension and no mass. There is no tenderness.  Musculoskeletal: Normal range of motion.  Negative straight leg raise bilaterally  Neurological: She is alert and oriented to person, place, and time.    BMET CMP 11/26/2014 06/06/2014 03/03/2011  Glucose 101(H) 78 85  BUN Creatinine 0.69 0.80 0.84  Sodium 139 139 135  Potassium 3.2(L) 5.1 3.4(L)  Chloride 100(L) 103 100  CO2 Calcium 9.3 9.4 8.8  Total Protein 7.3 7.3 6.8  Total Bilirubin 0.4 0.5 0.3  Alkaline Phos 38 35(L) 60  AST ALT Lipid Panel     Component Value Date/Time   CHOL 171 12/03/2014 1436   TRIG 103 12/03/2014 1436   HDL 39* 12/03/2014 1436   CHOLHDL 4.4 12/03/2014 1436   VLDL 21 12/03/2014 1436   LDLCALC 111* 12/03/2014 1436     Lab Results  Component Value Date   TSH 0.112* 12/03/2014      Assessment & Plan:   1. Chronic lower back pain Currently uncontrolled due to running out of medications - naproxen (NAPROSYN) 500 MG tablet; Take 1 tablet (500 mg total) by mouth 2 (two) times daily with a meal.  Dispense: 30 tablet; Refill: 3  2. Back muscle spasm Advised to apply heat - cyclobenzaprine (FLEXERIL) 10 MG tablet; Take 1 tablet (10 mg total) by mouth 2 (two) times daily as needed for muscle spasms.  Dispense: 60 tablet; Refill: 2  3. Hypokalemia Last set of labs from 8 months ago revealed hypokalemia and she is refusing labs at this time due to fear of incurring a bill  4. Hyperlipidemia Last lipid panel was normal but the patient has been off atorvastatin because she takes herbs at this time and is working on a low-sodium, low-cholesterol diet Refuses a repeat lipid panel  5. Abnormal TSH Last TSH was 0.112. Again the patient is refusing labs and I have educated her about the implication of her actions.  6. Tobacco abuse: Spent 3 minutes counseling on smoking cessation and she is not  interested in quitting at this time.  Advised to come in for complete physical exam at her next office visit including a Pap smear.   Meds ordered this encounter  Medications  . cyclobenzaprine (FLEXERIL) 10 MG tablet    Sig: Take 1 tablet (10 mg total) by mouth 2 (two) times daily as needed for muscle spasms.    Dispense:  60 tablet    Refill:  2  . naproxen (NAPROSYN) 500 MG tablet    Sig: Take 1 tablet (500 mg total) by mouth 2 (two) times daily with a meal.    Dispense:  30 tablet    Refill:  3    Follow-up: Return in about 6 weeks (around 09/07/2015) for complete physical exam.   Jaclyn Shaggy MD

## 2015-08-17 ENCOUNTER — Ambulatory Visit: Payer: Self-pay

## 2015-08-20 ENCOUNTER — Ambulatory Visit: Payer: Self-pay

## 2015-09-07 ENCOUNTER — Encounter: Payer: Self-pay | Admitting: Family Medicine

## 2015-09-11 ENCOUNTER — Encounter: Payer: Self-pay | Admitting: Family Medicine

## 2015-09-18 ENCOUNTER — Encounter: Payer: Self-pay | Admitting: Family Medicine

## 2015-09-30 ENCOUNTER — Encounter: Payer: Self-pay | Admitting: Family Medicine

## 2015-09-30 ENCOUNTER — Ambulatory Visit: Payer: Self-pay | Attending: Family Medicine | Admitting: Family Medicine

## 2015-09-30 VITALS — BP 109/68 | HR 82 | Temp 98.1°F | Resp 15 | Ht 60.0 in | Wt 101.8 lb

## 2015-09-30 DIAGNOSIS — Z131 Encounter for screening for diabetes mellitus: Secondary | ICD-10-CM

## 2015-09-30 DIAGNOSIS — R87610 Atypical squamous cells of undetermined significance on cytologic smear of cervix (ASC-US): Secondary | ICD-10-CM | POA: Insufficient documentation

## 2015-09-30 DIAGNOSIS — R946 Abnormal results of thyroid function studies: Secondary | ICD-10-CM | POA: Insufficient documentation

## 2015-09-30 DIAGNOSIS — A5901 Trichomonal vulvovaginitis: Secondary | ICD-10-CM | POA: Insufficient documentation

## 2015-09-30 DIAGNOSIS — Z01419 Encounter for gynecological examination (general) (routine) without abnormal findings: Secondary | ICD-10-CM

## 2015-09-30 DIAGNOSIS — Z79899 Other long term (current) drug therapy: Secondary | ICD-10-CM | POA: Insufficient documentation

## 2015-09-30 DIAGNOSIS — E785 Hyperlipidemia, unspecified: Secondary | ICD-10-CM

## 2015-09-30 DIAGNOSIS — M62830 Muscle spasm of back: Secondary | ICD-10-CM

## 2015-09-30 DIAGNOSIS — R7989 Other specified abnormal findings of blood chemistry: Secondary | ICD-10-CM

## 2015-09-30 DIAGNOSIS — Z124 Encounter for screening for malignant neoplasm of cervix: Secondary | ICD-10-CM

## 2015-09-30 DIAGNOSIS — R21 Rash and other nonspecific skin eruption: Secondary | ICD-10-CM | POA: Insufficient documentation

## 2015-09-30 DIAGNOSIS — W57XXXA Bitten or stung by nonvenomous insect and other nonvenomous arthropods, initial encounter: Secondary | ICD-10-CM | POA: Insufficient documentation

## 2015-09-30 DIAGNOSIS — Z1239 Encounter for other screening for malignant neoplasm of breast: Secondary | ICD-10-CM

## 2015-09-30 DIAGNOSIS — Z9119 Patient's noncompliance with other medical treatment and regimen: Secondary | ICD-10-CM | POA: Insufficient documentation

## 2015-09-30 DIAGNOSIS — B86 Scabies: Secondary | ICD-10-CM

## 2015-09-30 LAB — COMPREHENSIVE METABOLIC PANEL
ALK PHOS: 33 U/L (ref 33–130)
ALT: 11 U/L (ref 6–29)
AST: 23 U/L (ref 10–35)
Albumin: 3.3 g/dL — ABNORMAL LOW (ref 3.6–5.1)
BUN: 11 mg/dL (ref 7–25)
CHLORIDE: 103 mmol/L (ref 98–110)
CO2: 27 mmol/L (ref 20–31)
Calcium: 8.4 mg/dL — ABNORMAL LOW (ref 8.6–10.4)
Creat: 0.83 mg/dL (ref 0.50–1.05)
Glucose, Bld: 76 mg/dL (ref 65–99)
Potassium: 3.9 mmol/L (ref 3.5–5.3)
Sodium: 136 mmol/L (ref 135–146)
TOTAL PROTEIN: 5.3 g/dL — AB (ref 6.1–8.1)
Total Bilirubin: 0.4 mg/dL (ref 0.2–1.2)

## 2015-09-30 LAB — LIPID PANEL
Cholesterol: 182 mg/dL (ref 125–200)
HDL: 51 mg/dL (ref >=46)
LDL Cholesterol: 119 mg/dL (ref <130)
Total CHOL/HDL Ratio: 3.6 Ratio (ref <=5.0)
Triglycerides: 58 mg/dL (ref <150)
VLDL: 12 mg/dL (ref <30)

## 2015-09-30 LAB — POCT GLYCOSYLATED HEMOGLOBIN (HGB A1C): Hemoglobin A1C: 4.8

## 2015-09-30 MED ORDER — PERMETHRIN 5 % EX CREA: 1.0000 "application " | TOPICAL_CREAM | Freq: Once | CUTANEOUS | Status: DC

## 2015-09-30 MED ORDER — CYCLOBENZAPRINE HCL 10 MG PO TABS: 10.0000 mg | ORAL_TABLET | Freq: Two times a day (BID) | ORAL | Status: DC | PRN

## 2015-09-30 MED FILL — PERMETHRIN 5% CREAM: 5 | 14 days supply | Qty: 60 | Fill #0

## 2015-09-30 NOTE — Progress Notes (Signed)
Patient here for annual pap She complains of bites on her body

## 2015-09-30 NOTE — Patient Instructions (Signed)
Preventive Care for Adults, Female A healthy lifestyle and preventive care can promote health and wellness. Preventive health guidelines for women include the following key practices.  A routine yearly physical is a good way to check with your health care provider about your health and preventive screening. It is a chance to share any concerns and updates on your health and to receive a thorough exam.  Visit your dentist for a routine exam and preventive care every 6 months. Brush your teeth twice a day and floss once a day. Good oral hygiene prevents tooth decay and gum disease.  The frequency of eye exams is based on your age, health, family medical history, use of contact lenses, and other factors. Follow your health care provider's recommendations for frequency of eye exams.  Eat a healthy diet. Foods like vegetables, fruits, whole grains, low-fat dairy products, and lean protein foods contain the nutrients you need without too many calories. Decrease your intake of foods high in solid fats, added sugars, and salt. Eat the right amount of calories for you.Get information about a proper diet from your health care provider, if necessary.  Regular physical exercise is one of the most important things you can do for your health. Most adults should get at least 150 minutes of moderate-intensity exercise (any activity that increases your heart rate and causes you to sweat) each week. In addition, most adults need muscle-strengthening exercises on 2 or more days a week.  Maintain a healthy weight. The body mass index (BMI) is a screening tool to identify possible weight problems. It provides an estimate of body fat based on height and weight. Your health care provider can find your BMI and can help you achieve or maintain a healthy weight.For adults 20 years and older:  A BMI below 18.5 is considered underweight.  A BMI of 18.5 to 24.9 is normal.  A BMI of 25 to 29.9 is considered  overweight.  A BMI of 30 and above is considered obese.  Maintain normal blood lipids and cholesterol levels by exercising and minimizing your intake of saturated fat. Eat a balanced diet with plenty of fruit and vegetables. Blood tests for lipids and cholesterol should begin at age 64 and be repeated every 5 years. If your lipid or cholesterol levels are high, you are over 50, or you are at high risk for heart disease, you may need your cholesterol levels checked more frequently.Ongoing high lipid and cholesterol levels should be treated with medicines if diet and exercise are not working.  If you smoke, find out from your health care provider how to quit. If you do not use tobacco, do not start.  Lung cancer screening is recommended for adults aged 52-80 years who are at high risk for developing lung cancer because of a history of smoking. A yearly low-dose CT scan of the lungs is recommended for people who have at least a 30-pack-year history of smoking and are a current smoker or have quit within the past 15 years. A pack year of smoking is smoking an average of 1 pack of cigarettes a day for 1 year (for example: 1 pack a day for 30 years or 2 packs a day for 15 years). Yearly screening should continue until the smoker has stopped smoking for at least 15 years. Yearly screening should be stopped for people who develop a health problem that would prevent them from having lung cancer treatment.  If you are pregnant, do not drink alcohol. If you are  breastfeeding, be very cautious about drinking alcohol. If you are not pregnant and choose to drink alcohol, do not have more than 1 drink per day. One drink is considered to be 12 ounces (355 mL) of beer, 5 ounces (148 mL) of wine, or 1.5 ounces (44 mL) of liquor.  Avoid use of street drugs. Do not share needles with anyone. Ask for help if you need support or instructions about stopping the use of drugs.  High blood pressure causes heart disease and  increases the risk of stroke. Your blood pressure should be checked at least every 1 to 2 years. Ongoing high blood pressure should be treated with medicines if weight loss and exercise do not work.  If you are 25-78 years old, ask your health care provider if you should take aspirin to prevent strokes.  Diabetes screening is done by taking a blood sample to check your blood glucose level after you have not eaten for a certain period of time (fasting). If you are not overweight and you do not have risk factors for diabetes, you should be screened once every 3 years starting at age 86. If you are overweight or obese and you are 3-87 years of age, you should be screened for diabetes every year as part of your cardiovascular risk assessment.  Breast cancer screening is essential preventive care for women. You should practice "breast self-awareness." This means understanding the normal appearance and feel of your breasts and may include breast self-examination. Any changes detected, no matter how small, should be reported to a health care provider. Women in their 66s and 30s should have a clinical breast exam (CBE) by a health care provider as part of a regular health exam every 1 to 3 years. After age 43, women should have a CBE every year. Starting at age 37, women should consider having a mammogram (breast X-ray test) every year. Women who have a family history of breast cancer should talk to their health care provider about genetic screening. Women at a high risk of breast cancer should talk to their health care providers about having an MRI and a mammogram every year.  Breast cancer gene (BRCA)-related cancer risk assessment is recommended for women who have family members with BRCA-related cancers. BRCA-related cancers include breast, ovarian, tubal, and peritoneal cancers. Having family members with these cancers may be associated with an increased risk for harmful changes (mutations) in the breast  cancer genes BRCA1 and BRCA2. Results of the assessment will determine the need for genetic counseling and BRCA1 and BRCA2 testing.  Your health care provider may recommend that you be screened regularly for cancer of the pelvic organs (ovaries, uterus, and vagina). This screening involves a pelvic examination, including checking for microscopic changes to the surface of your cervix (Pap test). You may be encouraged to have this screening done every 3 years, beginning at age 78.  For women ages 79-65, health care providers may recommend pelvic exams and Pap testing every 3 years, or they may recommend the Pap and pelvic exam, combined with testing for human papilloma virus (HPV), every 5 years. Some types of HPV increase your risk of cervical cancer. Testing for HPV may also be done on women of any age with unclear Pap test results.  Other health care providers may not recommend any screening for nonpregnant women who are considered low risk for pelvic cancer and who do not have symptoms. Ask your health care provider if a screening pelvic exam is right for  you.  If you have had past treatment for cervical cancer or a condition that could lead to cancer, you need Pap tests and screening for cancer for at least 20 years after your treatment. If Pap tests have been discontinued, your risk factors (such as having a new sexual partner) need to be reassessed to determine if screening should resume. Some women have medical problems that increase the chance of getting cervical cancer. In these cases, your health care provider may recommend more frequent screening and Pap tests.  Colorectal cancer can be detected and often prevented. Most routine colorectal cancer screening begins at the age of 50 years and continues through age 75 years. However, your health care provider may recommend screening at an earlier age if you have risk factors for colon cancer. On a yearly basis, your health care provider may provide  home test kits to check for hidden blood in the stool. Use of a small camera at the end of a tube, to directly examine the colon (sigmoidoscopy or colonoscopy), can detect the earliest forms of colorectal cancer. Talk to your health care provider about this at age 50, when routine screening begins. Direct exam of the colon should be repeated every 5-10 years through age 75 years, unless early forms of precancerous polyps or small growths are found.  People who are at an increased risk for hepatitis B should be screened for this virus. You are considered at high risk for hepatitis B if:  You were born in a country where hepatitis B occurs often. Talk with your health care provider about which countries are considered high risk.  Your parents were born in a high-risk country and you have not received a shot to protect against hepatitis B (hepatitis B vaccine).  You have HIV or AIDS.  You use needles to inject street drugs.  You live with, or have sex with, someone who has hepatitis B.  You get hemodialysis treatment.  You take certain medicines for conditions like cancer, organ transplantation, and autoimmune conditions.  Hepatitis C blood testing is recommended for all people born from 1945 through 1965 and any individual with known risks for hepatitis C.  Practice safe sex. Use condoms and avoid high-risk sexual practices to reduce the spread of sexually transmitted infections (STIs). STIs include gonorrhea, chlamydia, syphilis, trichomonas, herpes, HPV, and human immunodeficiency virus (HIV). Herpes, HIV, and HPV are viral illnesses that have no cure. They can result in disability, cancer, and death.  You should be screened for sexually transmitted illnesses (STIs) including gonorrhea and chlamydia if:  You are sexually active and are younger than 24 years.  You are older than 24 years and your health care provider tells you that you are at risk for this type of infection.  Your sexual  activity has changed since you were last screened and you are at an increased risk for chlamydia or gonorrhea. Ask your health care provider if you are at risk.  If you are at risk of being infected with HIV, it is recommended that you take a prescription medicine daily to prevent HIV infection. This is called preexposure prophylaxis (PrEP). You are considered at risk if:  You are sexually active and do not regularly use condoms or know the HIV status of your partner(s).  You take drugs by injection.  You are sexually active with a partner who has HIV.  Talk with your health care provider about whether you are at high risk of being infected with HIV. If   you choose to begin PrEP, you should first be tested for HIV. You should then be tested every 3 months for as long as you are taking PrEP.  Osteoporosis is a disease in which the bones lose minerals and strength with aging. This can result in serious bone fractures or breaks. The risk of osteoporosis can be identified using a bone density scan. Women ages 1 years and over and women at risk for fractures or osteoporosis should discuss screening with their health care providers. Ask your health care provider whether you should take a calcium supplement or vitamin D to reduce the rate of osteoporosis.  Menopause can be associated with physical symptoms and risks. Hormone replacement therapy is available to decrease symptoms and risks. You should talk to your health care provider about whether hormone replacement therapy is right for you.  Use sunscreen. Apply sunscreen liberally and repeatedly throughout the day. You should seek shade when your shadow is shorter than you. Protect yourself by wearing long sleeves, pants, a wide-brimmed hat, and sunglasses year round, whenever you are outdoors.  Once a month, do a whole body skin exam, using a mirror to look at the skin on your back. Tell your health care provider of new moles, moles that have irregular  borders, moles that are larger than a pencil eraser, or moles that have changed in shape or color.  Stay current with required vaccines (immunizations).  Influenza vaccine. All adults should be immunized every year.  Tetanus, diphtheria, and acellular pertussis (Td, Tdap) vaccine. Pregnant women should receive 1 dose of Tdap vaccine during each pregnancy. The dose should be obtained regardless of the length of time since the last dose. Immunization is preferred during the 27th-36th week of gestation. An adult who has not previously received Tdap or who does not know her vaccine status should receive 1 dose of Tdap. This initial dose should be followed by tetanus and diphtheria toxoids (Td) booster doses every 10 years. Adults with an unknown or incomplete history of completing a 3-dose immunization series with Td-containing vaccines should begin or complete a primary immunization series including a Tdap dose. Adults should receive a Td booster every 10 years.  Varicella vaccine. An adult without evidence of immunity to varicella should receive 2 doses or a second dose if she has previously received 1 dose. Pregnant females who do not have evidence of immunity should receive the first dose after pregnancy. This first dose should be obtained before leaving the health care facility. The second dose should be obtained 4-8 weeks after the first dose.  Human papillomavirus (HPV) vaccine. Females aged 13-26 years who have not received the vaccine previously should obtain the 3-dose series. The vaccine is not recommended for use in pregnant females. However, pregnancy testing is not needed before receiving a dose. If a female is found to be pregnant after receiving a dose, no treatment is needed. In that case, the remaining doses should be delayed until after the pregnancy. Immunization is recommended for any person with an immunocompromised condition through the age of 24 years if she did not get any or all doses  earlier. During the 3-dose series, the second dose should be obtained 4-8 weeks after the first dose. The third dose should be obtained 24 weeks after the first dose and 16 weeks after the second dose.  Zoster vaccine. One dose is recommended for adults aged 97 years or older unless certain conditions are present.  Measles, mumps, and rubella (MMR) vaccine. Adults born  before 1957 generally are considered immune to measles and mumps. Adults born in [birth year] or later should have 1 or more doses of MMR vaccine unless there is a contraindication to the vaccine or there is laboratory evidence of immunity to each of the three diseases. A routine second dose of MMR vaccine should be obtained at least 28 days after the first dose for students attending postsecondary schools, health care workers, or international travelers. People who received inactivated measles vaccine or an unknown type of measles vaccine during 1963-1967 should receive 2 doses of MMR vaccine. People who received inactivated mumps vaccine or an unknown type of mumps vaccine before 1979 and are at high risk for mumps infection should consider immunization with 2 doses of MMR vaccine. For females of childbearing age, rubella immunity should be determined. If there is no evidence of immunity, females who are not pregnant should be vaccinated. If there is no evidence of immunity, females who are pregnant should delay immunization until after pregnancy. Unvaccinated health care workers born before 60 who lack laboratory evidence of measles, mumps, or rubella immunity or laboratory confirmation of disease should consider measles and mumps immunization with 2 doses of MMR vaccine or rubella immunization with 1 dose of MMR vaccine.  Pneumococcal 13-valent conjugate (PCV13) vaccine. When indicated, a person who is uncertain of his immunization history and has no record of immunization should receive the PCV13 vaccine. All adults 61 years of age and older  should receive this vaccine. An adult aged 92 years or older who has certain medical conditions and has not been previously immunized should receive 1 dose of PCV13 vaccine. This PCV13 should be followed with a dose of pneumococcal polysaccharide (PPSV23) vaccine. Adults who are at high risk for pneumococcal disease should obtain the PPSV23 vaccine at least 8 weeks after the dose of PCV13 vaccine. Adults older than 51 years of age who have normal immune system function should obtain the PPSV23 vaccine dose at least 1 year after the dose of PCV13 vaccine.  Pneumococcal polysaccharide (PPSV23) vaccine. When PCV13 is also indicated, PCV13 should be obtained first. All adults aged 2 years and older should be immunized. An adult younger than age 30 years who has certain medical conditions should be immunized. Any person who resides in a nursing home or long-term care facility should be immunized. An adult smoker should be immunized. People with an immunocompromised condition and certain other conditions should receive both PCV13 and PPSV23 vaccines. People with human immunodeficiency virus (HIV) infection should be immunized as soon as possible after diagnosis. Immunization during chemotherapy or radiation therapy should be avoided. Routine use of PPSV23 vaccine is not recommended for American Indians, [place] Natives, or people younger than 65 years unless there are medical conditions that require PPSV23 vaccine. When indicated, people who have unknown immunization and have no record of immunization should receive PPSV23 vaccine. One-time revaccination 5 years after the first dose of PPSV23 is recommended for people aged 19-64 years who have chronic kidney failure, nephrotic syndrome, asplenia, or immunocompromised conditions. People who received 1-2 doses of PPSV23 before age 44 years should receive another dose of PPSV23 vaccine at age 83 years or later if at least 5 years have passed since the previous dose. Doses  of PPSV23 are not needed for people immunized with PPSV23 at or after age 20 years.  Meningococcal vaccine. Adults with asplenia or persistent complement component deficiencies should receive 2 doses of quadrivalent meningococcal conjugate (MenACWY-D) vaccine. The doses should be obtained  at least 2 months apart. Microbiologists working with certain meningococcal bacteria, [place] recruits, people at risk during an outbreak, and people who travel to or live in countries with a high rate of meningitis should be immunized. A first-year college student up through age 28 years who is living in a residence hall should receive a dose if she did not receive a dose on or after her 16th birthday. Adults who have certain high-risk conditions should receive one or more doses of vaccine.  Hepatitis A vaccine. Adults who wish to be protected from this disease, have certain high-risk conditions, work with hepatitis A-infected animals, work in hepatitis A research labs, or travel to or work in countries with a high rate of hepatitis A should be immunized. Adults who were previously unvaccinated and who anticipate close contact with an international adoptee during the first 60 days after arrival in the [place] from a country with a high rate of hepatitis A should be immunized.  Hepatitis B vaccine. Adults who wish to be protected from this disease, have certain high-risk conditions, may be exposed to blood or other infectious body fluids, are household contacts or sex partners of hepatitis B positive people, are clients or workers in certain care facilities, or travel to or work in countries with a high rate of hepatitis B should be immunized.  Haemophilus influenzae type b (Hib) vaccine. A previously unvaccinated person with asplenia or sickle cell disease or having a scheduled splenectomy should receive 1 dose of Hib vaccine. Regardless of previous immunization, a recipient of a hematopoietic stem cell transplant  should receive a 3-dose series 6-12 months after her successful transplant. Hib vaccine is not recommended for adults with HIV infection. Preventive Services / Frequency Ages 71 to 87 years  Blood pressure check.** / Every 3-5 years.  Lipid and cholesterol check.** / Every 5 years beginning at age 1.  Clinical breast exam.** / Every 3 years for women in their 3s and 31s.  BRCA-related cancer risk assessment.** / For women who have family members with a BRCA-related cancer (breast, ovarian, tubal, or peritoneal cancers).  Pap test.** / Every 2 years from ages 50 through 86. Every 3 years starting at age 87 through age 7 or 75 with a history of 3 consecutive normal Pap tests.  HPV screening.** / Every 3 years from ages 59 through ages 35 to 6 with a history of 3 consecutive normal Pap tests.  Hepatitis C blood test.** / For any individual with known risks for hepatitis C.  Skin self-exam. / Monthly.  Influenza vaccine. / Every year.  Tetanus, diphtheria, and acellular pertussis (Tdap, Td) vaccine.** / Consult your health care provider. Pregnant women should receive 1 dose of Tdap vaccine during each pregnancy. 1 dose of Td every 10 years.  Varicella vaccine.** / Consult your health care provider. Pregnant females who do not have evidence of immunity should receive the first dose after pregnancy.  HPV vaccine. / 3 doses over 6 months, if 72 and younger. The vaccine is not recommended for use in pregnant females. However, pregnancy testing is not needed before receiving a dose.  Measles, mumps, rubella (MMR) vaccine.** / You need at least 1 dose of MMR if you were born in 1957 or later. You may also need a 2nd dose. For females of childbearing age, rubella immunity should be determined. If there is no evidence of immunity, females who are not pregnant should be vaccinated. If there is no evidence of immunity, females who are  pregnant should delay immunization until after  pregnancy.  Pneumococcal 13-valent conjugate (PCV13) vaccine.** / Consult your health care provider.  Pneumococcal polysaccharide (PPSV23) vaccine.** / 1 to 2 doses if you smoke cigarettes or if you have certain conditions.  Meningococcal vaccine.** / 1 dose if you are age 87 to 44 years and a Market researcher living in a residence hall, or have one of several medical conditions, you need to get vaccinated against meningococcal disease. You may also need additional booster doses.  Hepatitis A vaccine.** / Consult your health care provider.  Hepatitis B vaccine.** / Consult your health care provider.  Haemophilus influenzae type b (Hib) vaccine.** / Consult your health care provider. Ages 86 to 38 years  Blood pressure check.** / Every year.  Lipid and cholesterol check.** / Every 5 years beginning at age 49 years.  Lung cancer screening. / Every year if you are aged 71-80 years and have a 30-pack-year history of smoking and currently smoke or have quit within the past 15 years. Yearly screening is stopped once you have quit smoking for at least 15 years or develop a health problem that would prevent you from having lung cancer treatment.  Clinical breast exam.** / Every year after age 51 years.  BRCA-related cancer risk assessment.** / For women who have family members with a BRCA-related cancer (breast, ovarian, tubal, or peritoneal cancers).  Mammogram.** / Every year beginning at age 18 years and continuing for as long as you are in good health. Consult with your health care provider.  Pap test.** / Every 3 years starting at age 63 years through age 37 or 57 years with a history of 3 consecutive normal Pap tests.  HPV screening.** / Every 3 years from ages 41 years through ages 76 to 23 years with a history of 3 consecutive normal Pap tests.  Fecal occult blood test (FOBT) of stool. / Every year beginning at age 36 years and continuing until age 51 years. You may not need  to do this test if you get a colonoscopy every 10 years.  Flexible sigmoidoscopy or colonoscopy.** / Every 5 years for a flexible sigmoidoscopy or every 10 years for a colonoscopy beginning at age 36 years and continuing until age 35 years.  Hepatitis C blood test.** / For all people born from [birth year] through 1965 and any individual with known risks for hepatitis C.  Skin self-exam. / Monthly.  Influenza vaccine. / Every year.  Tetanus, diphtheria, and acellular pertussis (Tdap/Td) vaccine.** / Consult your health care provider. Pregnant women should receive 1 dose of Tdap vaccine during each pregnancy. 1 dose of Td every 10 years.  Varicella vaccine.** / Consult your health care provider. Pregnant females who do not have evidence of immunity should receive the first dose after pregnancy.  Zoster vaccine.** / 1 dose for adults aged 73 years or older.  Measles, mumps, rubella (MMR) vaccine.** / You need at least 1 dose of MMR if you were born in 1957 or later. You may also need a second dose. For females of childbearing age, rubella immunity should be determined. If there is no evidence of immunity, females who are not pregnant should be vaccinated. If there is no evidence of immunity, females who are pregnant should delay immunization until after pregnancy.  Pneumococcal 13-valent conjugate (PCV13) vaccine.** / Consult your health care provider.  Pneumococcal polysaccharide (PPSV23) vaccine.** / 1 to 2 doses if you smoke cigarettes or if you have certain conditions.  Meningococcal vaccine.** /  Consult your health care provider.  Hepatitis A vaccine.** / Consult your health care provider.  Hepatitis B vaccine.** / Consult your health care provider.  Haemophilus influenzae type b (Hib) vaccine.** / Consult your health care provider. Ages 80 years and over  Blood pressure check.** / Every year.  Lipid and cholesterol check.** / Every 5 years beginning at age 62 years.  Lung cancer  screening. / Every year if you are aged 32-80 years and have a 30-pack-year history of smoking and currently smoke or have quit within the past 15 years. Yearly screening is stopped once you have quit smoking for at least 15 years or develop a health problem that would prevent you from having lung cancer treatment.  Clinical breast exam.** / Every year after age 61 years.  BRCA-related cancer risk assessment.** / For women who have family members with a BRCA-related cancer (breast, ovarian, tubal, or peritoneal cancers).  Mammogram.** / Every year beginning at age 39 years and continuing for as long as you are in good health. Consult with your health care provider.  Pap test.** / Every 3 years starting at age 85 years through age 74 or 72 years with 3 consecutive normal Pap tests. Testing can be stopped between 65 and 70 years with 3 consecutive normal Pap tests and no abnormal Pap or HPV tests in the past 10 years.  HPV screening.** / Every 3 years from ages 55 years through ages 67 or 77 years with a history of 3 consecutive normal Pap tests. Testing can be stopped between 65 and 70 years with 3 consecutive normal Pap tests and no abnormal Pap or HPV tests in the past 10 years.  Fecal occult blood test (FOBT) of stool. / Every year beginning at age 81 years and continuing until age 22 years. You may not need to do this test if you get a colonoscopy every 10 years.  Flexible sigmoidoscopy or colonoscopy.** / Every 5 years for a flexible sigmoidoscopy or every 10 years for a colonoscopy beginning at age 67 years and continuing until age 22 years.  Hepatitis C blood test.** / For all people born from [birth year] through 1966 and any individual with known risks for hepatitis C.  Osteoporosis screening.** / A one-time screening for women ages 8 years and over and women at risk for fractures or osteoporosis.  Skin self-exam. / Monthly.  Influenza vaccine. / Every year.  Tetanus, diphtheria, and  acellular pertussis (Tdap/Td) vaccine.** / 1 dose of Td every 10 years.  Varicella vaccine.** / Consult your health care provider.  Zoster vaccine.** / 1 dose for adults aged 56 years or older.  Pneumococcal 13-valent conjugate (PCV13) vaccine.** / Consult your health care provider.  Pneumococcal polysaccharide (PPSV23) vaccine.** / 1 dose for all adults aged 15 years and older.  Meningococcal vaccine.** / Consult your health care provider.  Hepatitis A vaccine.** / Consult your health care provider.  Hepatitis B vaccine.** / Consult your health care provider.  Haemophilus influenzae type b (Hib) vaccine.** / Consult your health care provider. ** Family history and personal history of risk and conditions may change your health care provider's recommendations.   This information is not intended to replace advice given to you by your health care provider. Make sure you discuss any questions you have with your health care provider.   Document Released: 09/20/2001 Document Revised: 08/15/2014 Document Reviewed: 12/20/2010 Elsevier Interactive Patient Education [employer].

## 2015-09-30 NOTE — Progress Notes (Signed)
Subjective:  Patient ID: Denise Wiley, female    DOB: [birth year]  Age: 50 y.o. MRN: [id number]  CC: Gynecologic Exam   HPI Denise Wiley presents for a complete physical exam. She complains of pinpoint rash on her back which she thinks are from insect bites and she feels stinging in different areas of her body but is unsure if symptoms are worse at night.  Outpatient Prescriptions Prior to Visit  Medication Sig Dispense Refill  . naproxen (NAPROSYN) 500 MG tablet Take 1 tablet (500 mg total) by mouth 2 (two) times daily with a meal. 30 tablet 3  . cyclobenzaprine (FLEXERIL) 10 MG tablet Take 1 tablet (10 mg total) by mouth 2 (two) times daily as needed for muscle spasms. 60 tablet 2  . atorvastatin (LIPITOR) 20 MG tablet Take 1 tablet (20 mg total) by mouth daily. (Patient not taking: Reported on 07/27/2015) 90 tablet 3  . FIBER PO Take 1 tablet by mouth daily. Reported on 09/30/2015    . vitamin D, CHOLECALCIFEROL, 400 UNITS tablet Take 400 Units by mouth daily. Reported on 09/30/2015     No facility-administered medications prior to visit.    ROS Review of Systems  Constitutional: Negative for activity change, appetite change and fatigue.  HENT: Negative for congestion, sinus pressure and sore throat.   Eyes: Negative for visual disturbance.  Respiratory: Negative for cough, chest tightness, shortness of breath and wheezing.   Cardiovascular: Negative for chest pain and palpitations.  Gastrointestinal: Negative for abdominal pain, constipation and abdominal distention.  Endocrine: Negative for polydipsia.  Genitourinary: Negative for dysuria and frequency.  Musculoskeletal: Negative for back pain and arthralgias.  Skin: Positive for rash.  Neurological: Negative for tremors, light-headedness and numbness.  Hematological: Does not bruise/bleed easily.  Psychiatric/Behavioral: Negative for behavioral problems and agitation.    Objective:  BP 109/68 mmHg  Pulse 82   Temp(Src) 98.1 F (36.7 C)  Resp 15  Ht 5' (1.524 m)  Wt 101 lb 12.8 oz (46.176 kg)  BMI 19.88 kg/m2  SpO2 100%  BP/Weight 09/30/2015 07/27/2015 03/11/2015  Systolic BP 109 127 114  Diastolic BP 68 81 81  Wt. (Lbs) 101.8 108 118.6  BMI 19.88 21.09 23.16      Physical Exam  Constitutional: She is oriented to person, place, and time. She appears well-developed and well-nourished. No distress.  HENT:  Head: Normocephalic.  Right Ear: External ear normal.  Left Ear: External ear normal.  Nose: Nose normal.  Mouth/Throat: Oropharynx is clear and moist.  Eyes: Conjunctivae and EOM are normal. Pupils are equal, round, and reactive to light.  Neck: Normal range of motion. No JVD present.  Cardiovascular: Normal rate, regular rhythm, normal heart sounds and intact distal pulses.  Exam reveals no gallop.   No murmur heard. Pulmonary/Chest: Effort normal and breath sounds normal. No respiratory distress. She has no wheezes. She has no rales. She exhibits no tenderness.  Abdominal: Soft. Bowel sounds are normal. She exhibits no distension and no mass. There is no tenderness.  Musculoskeletal: Normal range of motion. She exhibits no edema or tenderness.  Neurological: She is alert and oriented to person, place, and time. She has normal reflexes.  Skin: She is not diaphoretic.  Diffuse pinpoint rash on back, gluteal region and anterior trunk  Psychiatric: She has a normal mood and affect.     Assessment & Plan:   1. Diabetes mellitus screening A1c 4.8 - HgB A1c  2. Scabies Advised on care of bedding -  permethrin (ACTICIN) 5 % cream; Apply 1 application topically once. Leave on for 8-14 hours then wash off.  Dispense: 60 g; Refill: 0  3. Abnormal TSH Last TSH was suppressed at 0.112. Repeat today and will initiate therapy if still suppressed - TSH  4. Hyperlipidemia Noncompliant with statin -using OTC pills - Lipid panel - Comprehensive metabolic panel  5. Back muscle  spasm - cyclobenzaprine (FLEXERIL) 10 MG tablet; Take 1 tablet (10 mg total) by mouth 2 (two) times daily as needed for muscle spasms.  Dispense: 60 tablet; Refill: 2  6. Screening for malignant neoplasm of cervix - Cytology - PAP ([place])  7. Breast screening - [facility] DIGITAL SCREENING BILATERAL; Future  8. Encounter for routine gynecological examination Up-to-date on colonoscopy-from last year. Refuses flu shot - HIV antibody (with reflex) - Tdap vaccine greater than or equal to 7yo IM   Meds ordered this encounter  Medications  . cyclobenzaprine (FLEXERIL) 10 MG tablet    Sig: Take 1 tablet (10 mg total) by mouth 2 (two) times daily as needed for muscle spasms.    Dispense:  60 tablet    Refill:  2  . permethrin (ACTICIN) 5 % cream    Sig: Apply 1 application topically once. Leave on for 8-14 hours then wash off.    Dispense:  60 g    Refill:  0    Follow-up: Return in 3 months (on 12/28/2015) for Follow-up on hyperlipidemia.   Jaclyn Shaggy MD

## 2015-10-01 ENCOUNTER — Other Ambulatory Visit: Payer: Self-pay | Admitting: Family Medicine

## 2015-10-01 ENCOUNTER — Telehealth: Payer: Self-pay | Admitting: *Deleted

## 2015-10-01 DIAGNOSIS — Z1231 Encounter for screening mammogram for malignant neoplasm of breast: Secondary | ICD-10-CM

## 2015-10-01 LAB — HIV ANTIBODY (ROUTINE TESTING W REFLEX): HIV: NONREACTIVE

## 2015-10-01 LAB — TSH: TSH: 0.66 m[IU]/L

## 2015-10-01 NOTE — Telephone Encounter (Signed)
-----   Message from Jaclyn Shaggy, MD sent at 10/01/2015  8:27 AM EST ----- Cholesterol, liver, renal function are normal, HIV is negative, PAP smear pending

## 2015-10-01 NOTE — Telephone Encounter (Signed)
Verified name and date of birth and gave results 

## 2015-10-05 LAB — CYTOLOGY - PAP

## 2015-10-07 ENCOUNTER — Other Ambulatory Visit: Payer: Self-pay | Admitting: Family Medicine

## 2015-10-07 ENCOUNTER — Telehealth: Payer: Self-pay | Admitting: *Deleted

## 2015-10-07 DIAGNOSIS — A599 Trichomoniasis, unspecified: Secondary | ICD-10-CM

## 2015-10-07 MED ORDER — METRONIDAZOLE 500 MG PO TABS: 2000.0000 mg | ORAL_TABLET | Freq: Once | ORAL | Status: DC

## 2015-10-07 NOTE — Telephone Encounter (Signed)
-----   Message from Jaclyn Shaggy, MD sent at 10/07/2015  1:47 PM EST ----- Her Pap smear reveals she has ASCUS (atypical cells of undetermined significant) but HPV is negative and so I will repeat her Pap smear in one year to follow-up on this; she also has trichomoniasis which is an STD and I have sent a prescription for metronidazole to her pharmacy and she is advised to have her partner treated as well.

## 2015-10-07 NOTE — Telephone Encounter (Signed)
Patient's phone number is for an unidentified female who states he lets her use his phone sometimes.  He is currently in the hospital and sates he will not be seeing her anytime soon.  This RN removed the other number in her chart because a man answered and stated she no longer lived there.  Mailed letter to address in chart.

## 2015-10-14 ENCOUNTER — Ambulatory Visit: Payer: Self-pay

## 2015-10-19 ENCOUNTER — Ambulatory Visit: Payer: Self-pay

## 2015-10-19 MED FILL — metroNIDAZOLE 500 MG TABS: 500 | 1 days supply | Qty: 4 | Fill #0

## 2015-10-19 NOTE — Telephone Encounter (Signed)
Patient called in and results given 

## 2015-10-30 ENCOUNTER — Ambulatory Visit: Payer: Self-pay

## 2015-11-06 ENCOUNTER — Ambulatory Visit: Payer: Self-pay | Attending: Family Medicine

## 2015-11-11 ENCOUNTER — Ambulatory Visit: Payer: Self-pay

## 2015-11-15 IMAGING — CR DG CHEST 2V
1 series · 2 of 2 positions shown · non-contrast
Comparison: 03/03/2011

CLINICAL DATA: Increased shortness of breath and chest pain

EXAM:
CHEST  2 VIEW

[Series 1: dxr chest pa (or ap) and lateral · 0.14mm/px · 2 of 2 slices shown]
[im 1/2]
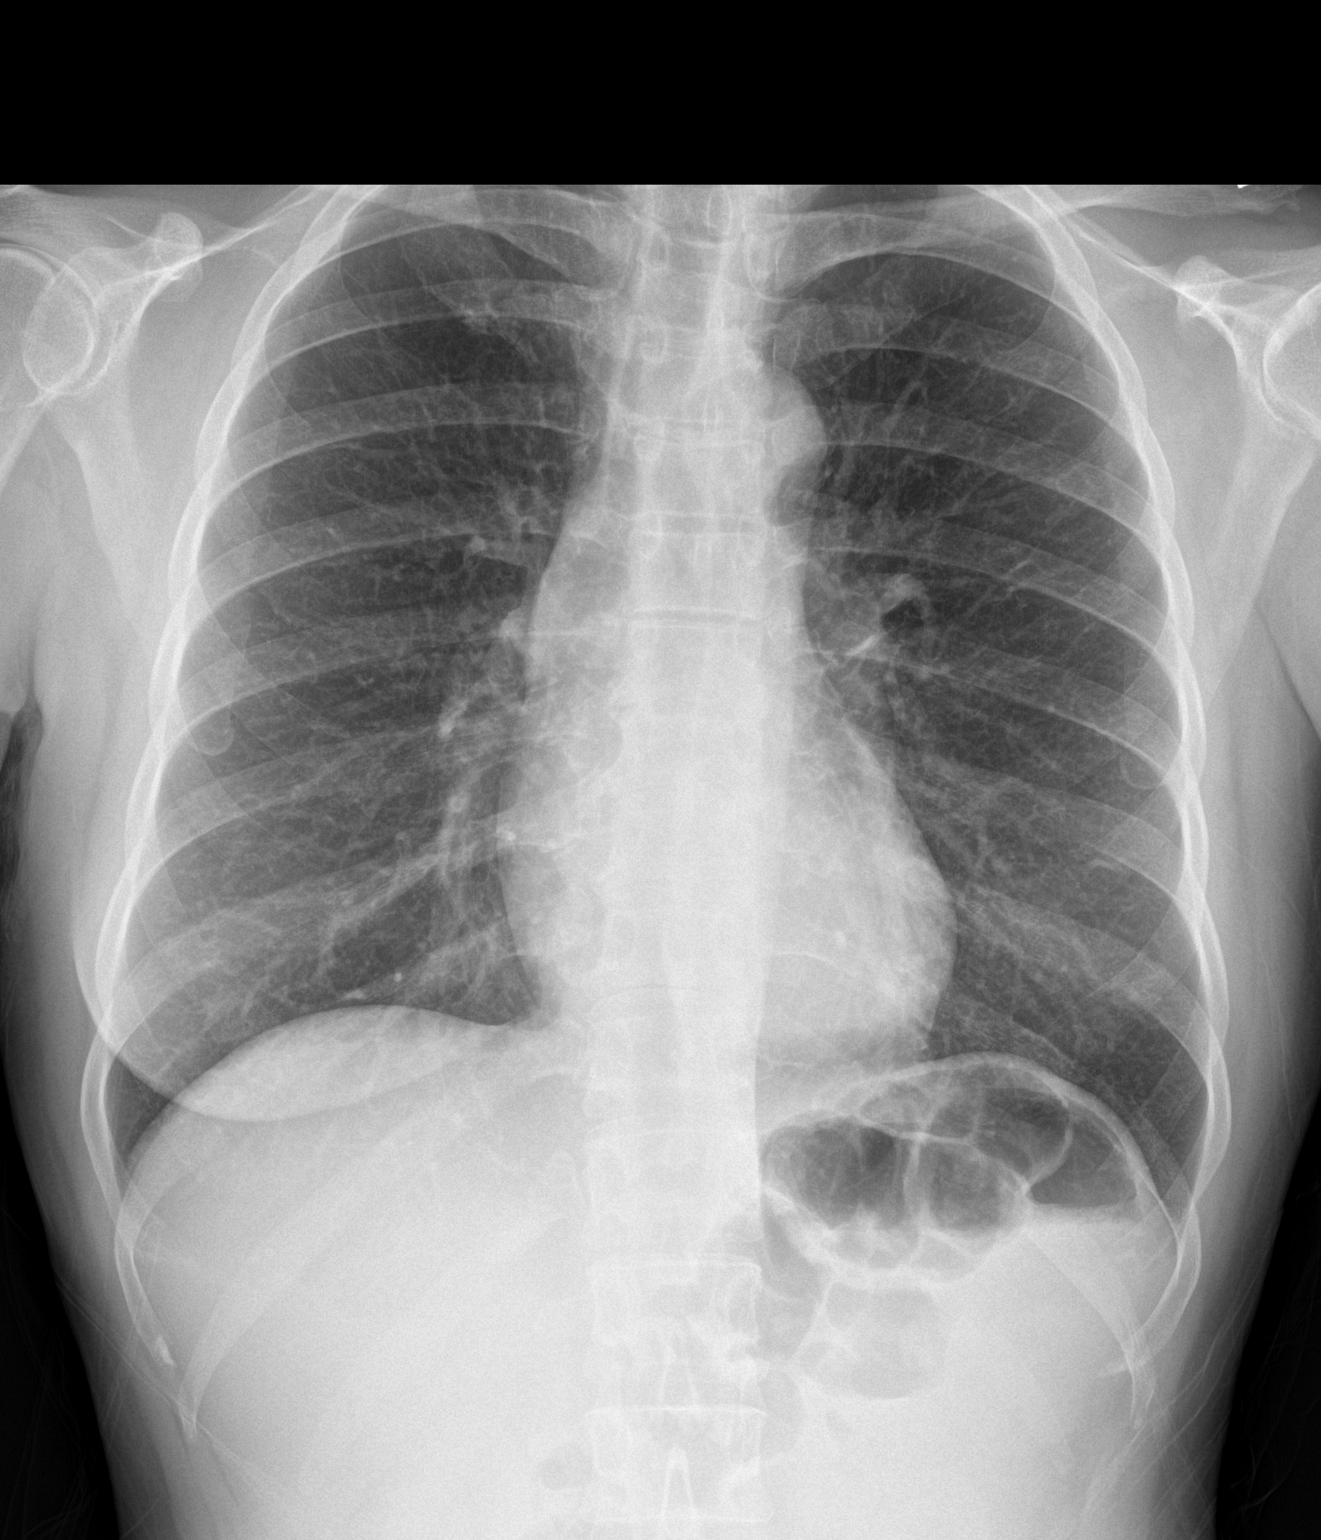
[im 2/2]
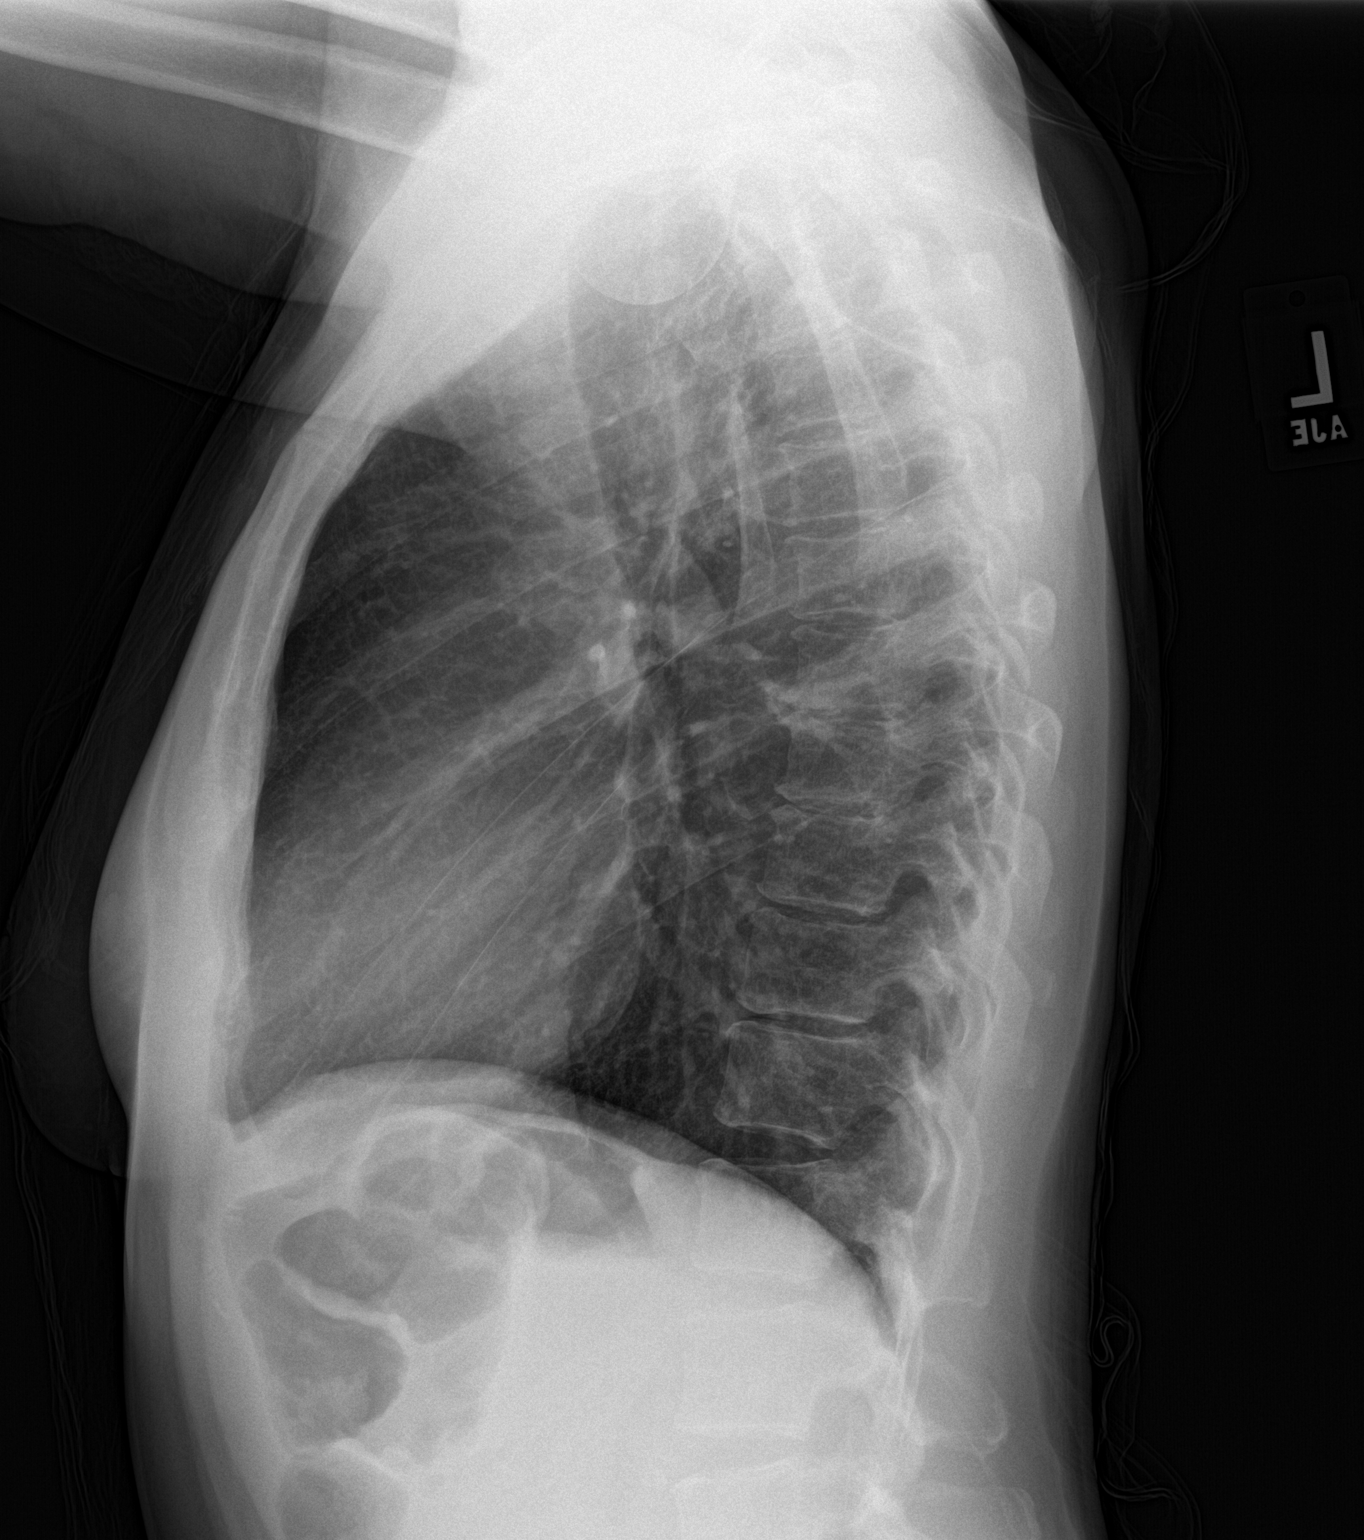

[2 of 2 positions shown; findings below may reference images not displayed]

FINDINGS: The heart size and mediastinal contours are within normal limits.
Both lungs are clear. The visualized skeletal structures are
unremarkable.
IMPRESSION: No active cardiopulmonary disease.

## 2015-11-16 ENCOUNTER — Encounter: Payer: Self-pay | Admitting: Family Medicine

## 2015-11-16 ENCOUNTER — Ambulatory Visit: Payer: Self-pay | Attending: Family Medicine | Admitting: Family Medicine

## 2015-11-16 VITALS — BP 118/83 | HR 79 | Temp 97.9°F | Resp 16 | Ht 60.0 in | Wt 100.0 lb

## 2015-11-16 DIAGNOSIS — B86 Scabies: Secondary | ICD-10-CM

## 2015-11-16 DIAGNOSIS — R21 Rash and other nonspecific skin eruption: Secondary | ICD-10-CM | POA: Insufficient documentation

## 2015-11-16 DIAGNOSIS — K009 Disorder of tooth development, unspecified: Secondary | ICD-10-CM

## 2015-11-16 DIAGNOSIS — Z79899 Other long term (current) drug therapy: Secondary | ICD-10-CM | POA: Insufficient documentation

## 2015-11-16 MED ORDER — PERMETHRIN 5 % EX CREA: 1.0000 "application " | TOPICAL_CREAM | Freq: Once | CUTANEOUS | Status: DC

## 2015-11-16 MED FILL — PERMETHRIN 5% CREAM: 5 | 1 days supply | Qty: 60 | Fill #0

## 2015-11-16 NOTE — Progress Notes (Signed)
Patient's here for dental referral.  Patient c/o of left ear pain that hasn't subsided since last OV.

## 2015-11-16 NOTE — Patient Instructions (Signed)

## 2015-11-16 NOTE — Progress Notes (Signed)
Subjective:  Patient ID: Denise Wiley, female    DOB: June 04, 1966  Age: 50 y.o. MRN: [id number]  CC: Referral   HPI Denise CandelaDiana L Watkin presents complaining of persisting rash on her forearms and left ear after being treated for scabies at her last visit. Denies earache or hearing loss. States she noticed some improvement and less prurtus. She has since moved out of her previous residence.  Complains of sensitivity at the site of a right l;ower premolar and states she thinks the crown of her tooth is falling off.  Outpatient Prescriptions Prior to Visit  Medication Sig Dispense Refill  . cyclobenzaprine (FLEXERIL) 10 MG tablet Take 1 tablet (10 mg total) by mouth 2 (two) times daily as needed for muscle spasms. 60 tablet 2  . FIBER PO Take 1 tablet by mouth daily. Reported on 09/30/2015    . metroNIDAZOLE (FLAGYL) 500 MG tablet Take 4 tablets (2,000 mg total) by mouth once. 4 tablet 0  . naproxen (NAPROSYN) 500 MG tablet Take 1 tablet (500 mg total) by mouth 2 (two) times daily with a meal. 30 tablet 3  . vitamin D, CHOLECALCIFEROL, 400 UNITS tablet Take 400 Units by mouth daily. Reported on 09/30/2015    . permethrin (ACTICIN) 5 % cream Apply 1 application topically once. Leave on for 8-14 hours then wash off. 60 g 0  . atorvastatin (LIPITOR) 20 MG tablet Take 1 tablet (20 mg total) by mouth daily. (Patient not taking: Reported on 07/27/2015) 90 tablet 3   No facility-administered medications prior to visit.    ROS Review of Systems  Constitutional: Negative for activity change and appetite change.  HENT: Positive for dental problem. Negative for sinus pressure and sore throat.   Respiratory: Negative for chest tightness, shortness of breath and wheezing.   Cardiovascular: Negative for chest pain and palpitations.  Gastrointestinal: Negative for abdominal pain, constipation and abdominal distention.  Genitourinary: Negative.   Musculoskeletal: Negative.   Skin:       See hpi    Psychiatric/Behavioral: Negative for behavioral problems and dysphoric mood.    Objective:  BP 118/83 mmHg  Pulse 79  Temp(Src) 97.9 F (36.6 C) (Oral)  Resp 16  Ht 5' (1.524 m)  Wt 100 lb (45.36 kg)  BMI 19.53 kg/m2  SpO2 100%  BP/Weight 11/16/2015 09/30/2015 07/27/2015  Systolic BP 118 109 127  Diastolic BP 83 68 81  Wt. (Lbs) 100 101.8 108  BMI 19.53 19.88 21.09      Physical Exam  Constitutional: She is oriented to person, place, and time. She appears well-developed and well-nourished.  HENT:  Feeling evident on multiple teeth. No evidence of inflammation  Cardiovascular: Normal rate, normal heart sounds and intact distal pulses.   No murmur heard. Pulmonary/Chest: Effort normal and breath sounds normal. She has no wheezes. She has no rales. She exhibits no tenderness.  Abdominal: Soft. Bowel sounds are normal. She exhibits no distension and no mass. There is no tenderness.  Musculoskeletal: Normal range of motion.  Neurological: She is alert and oriented to person, place, and time.  Skin:  Sparse and erythematous rash on upper extremities, and the nape of the neck which have improved compared to last visit.     Assessment & Plan:   1. Scabies  Discussed care of bedding.  symptoms have improved compared to last visit. - permethrin (ACTICIN) 5 % cream; Apply 1 application topically once. Leave on for 8-14 hours then wash off.  Dispense: 60 g; Refill: 1  2. Dental anomaly  no indication for antibiotic or analgesics at this time. - Ambulatory referral to Dentistry   Meds ordered this encounter  Medications  . DISCONTD: permethrin (ACTICIN) 5 % cream    Sig: Apply 1 application topically once. Leave on for 8-14 hours then wash off.    Dispense:  60 g    Refill:  0  . permethrin (ACTICIN) 5 % cream    Sig: Apply 1 application topically once. Leave on for 8-14 hours then wash off.    Dispense:  60 g    Refill:  1    Follow-up: Return in about 3 months  (around 02/15/2016) for follow up on scabies.   Jaclyn Shaggy MD

## 2015-12-21 ENCOUNTER — Ambulatory Visit: Payer: Self-pay

## 2015-12-29 ENCOUNTER — Ambulatory Visit: Payer: Self-pay

## 2016-01-08 ENCOUNTER — Ambulatory Visit: Payer: Self-pay

## 2016-01-11 ENCOUNTER — Ambulatory Visit
Admission: RE | Admit: 2016-01-11 | Discharge: 2016-01-11 | Disposition: A | Discharge status: Home / Self Care | Payer: No Typology Code available for payment source | Source: Ambulatory Visit | Attending: Family Medicine | Admitting: Family Medicine

## 2016-01-11 DIAGNOSIS — Z1231 Encounter for screening mammogram for malignant neoplasm of breast: Secondary | ICD-10-CM

## 2016-01-15 ENCOUNTER — Telehealth: Payer: Self-pay

## 2016-01-15 NOTE — Telephone Encounter (Signed)
-----   Message from Jaclyn ShaggyEnobong Amao, MD sent at 01/13/2016  8:33 AM EDT ----- Mammogram is negative for malignancy

## 2016-01-15 NOTE — Telephone Encounter (Signed)
Writer called patient to inform her that the mammogram she had was normal and there were no signs of malignancy.  Patient stated understanding.

## 2016-02-01 ENCOUNTER — Ambulatory Visit: Payer: No Typology Code available for payment source | Attending: Internal Medicine

## 2016-03-16 ENCOUNTER — Encounter: Payer: Self-pay | Admitting: Family Medicine

## 2016-03-16 ENCOUNTER — Ambulatory Visit: Payer: Self-pay | Attending: Family Medicine | Admitting: Family Medicine

## 2016-03-16 VITALS — BP 109/77 | HR 75 | Temp 97.8°F | Ht 60.0 in | Wt 99.4 lb

## 2016-03-16 DIAGNOSIS — Z720 Tobacco use: Secondary | ICD-10-CM

## 2016-03-16 DIAGNOSIS — R7989 Other specified abnormal findings of blood chemistry: Secondary | ICD-10-CM

## 2016-03-16 DIAGNOSIS — B86 Scabies: Secondary | ICD-10-CM | POA: Insufficient documentation

## 2016-03-16 DIAGNOSIS — F17210 Nicotine dependence, cigarettes, uncomplicated: Secondary | ICD-10-CM | POA: Insufficient documentation

## 2016-03-16 DIAGNOSIS — Z79899 Other long term (current) drug therapy: Secondary | ICD-10-CM | POA: Insufficient documentation

## 2016-03-16 DIAGNOSIS — M62830 Muscle spasm of back: Secondary | ICD-10-CM

## 2016-03-16 DIAGNOSIS — Z7982 Long term (current) use of aspirin: Secondary | ICD-10-CM | POA: Insufficient documentation

## 2016-03-16 LAB — TSH: TSH: 0.88 mIU/L

## 2016-03-16 MED ORDER — PERMETHRIN 5 % EX CREA: 1.0000 "application " | TOPICAL_CREAM | Freq: Once | CUTANEOUS | 1 refills | Status: AC

## 2016-03-16 MED ORDER — CYCLOBENZAPRINE HCL 10 MG PO TABS: 10.0000 mg | ORAL_TABLET | Freq: Two times a day (BID) | ORAL | 2 refills | Status: AC | PRN

## 2016-03-16 MED FILL — PERMETHRIN 5% CREAM: 5 | 15 days supply | Qty: 60 | Fill #0

## 2016-03-16 MED FILL — ?CYCLOBENZAPRINE 10 MG TABL: 10 | 30 days supply | Qty: 60 | Fill #0 | Status: TO

## 2016-03-16 NOTE — Progress Notes (Signed)
Medication refill Would like thyroid check Referral to derm
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
## 2016-03-25 ENCOUNTER — Telehealth: Payer: Self-pay

## 2016-03-25 NOTE — Telephone Encounter (Signed)
Writer called patient and LVM for patient to call back for test results.

## 2016-03-25 NOTE — Progress Notes (Signed)
Left voice mail requested call back.    Please advise patient per Dr. Venetia NightAmao: Please inform the patient that labs are normal

## 2016-03-25 NOTE — Telephone Encounter (Signed)
-----   Message from Enobong Amao, MD sent at 03/17/2016  8:41 AM EDT ----- Please inform the patient that labs are normal. Thank you. 

## 2016-03-28 ENCOUNTER — Telehealth: Payer: Self-pay

## 2016-03-28 NOTE — Telephone Encounter (Signed)
-----   Message from Jaclyn ShaggyEnobong Amao, MD sent at 03/17/2016  8:41 AM EDT ----- Please inform the patient that labs are normal. Thank you.

## 2016-03-28 NOTE — Telephone Encounter (Signed)
Patient called with normal thyroid testing per Dr. Venetia NightAmao.. Patient stated understanding.

## 2016-04-05 ENCOUNTER — Telehealth: Payer: Self-pay | Admitting: Family Medicine

## 2016-04-05 NOTE — Telephone Encounter (Signed)
Pt. Called requesting a letter form her PCP for food stamps. Pt. Needs the letter to state b/c of her medical condition she is unable to work. Please f/u

## 2016-04-06 NOTE — Telephone Encounter (Signed)
She does not have medical conditions that preclude her from working.

## 2016-04-06 NOTE — Telephone Encounter (Signed)
Called patient at [phone], left vm requesting callback.  When patient calls please advise per Dr. Venetia NightAmao: "she does not have medical conditions that preclude her from working".

## 2016-04-13 NOTE — Progress Notes (Signed)
Patient stopped in clinic today asking for a letter stating that she is unable to work because she needs her food stamps reissued . Per MD she is able to work and she will not write a letter stating this. Patient then asked for a note stating that is "able to work".  Dr. Venetia NightAmao asked that patient set up an appt to see MD and discuss. Patient was very unhappy with this because she needed to have her renewal issued today and now she will have to go without food. No appt was made.

## 2016-04-26 ENCOUNTER — Ambulatory Visit: Payer: Self-pay | Admitting: Family Medicine
